# Patient Record
Sex: Male | Born: 1960 | Race: White | Hispanic: No | Marital: Married | Smoking: Former smoker
Health system: Southern US, Community
[De-identification: ages and names within clinical notes are randomized; demographics above are authoritative.]

## PROBLEM LIST (undated history)

## (undated) DIAGNOSIS — K469 Unspecified abdominal hernia without obstruction or gangrene: Secondary | ICD-10-CM

## (undated) DIAGNOSIS — E785 Hyperlipidemia, unspecified: Secondary | ICD-10-CM

## (undated) HISTORY — DX: Hyperlipidemia, unspecified: E78.5

---

## 2011-02-22 ENCOUNTER — Encounter: Payer: Self-pay | Admitting: Family Medicine

## 2011-02-22 ENCOUNTER — Ambulatory Visit (INDEPENDENT_AMBULATORY_CARE_PROVIDER_SITE_OTHER): Payer: BC Managed Care – PPO | Admitting: Family Medicine

## 2011-02-22 DIAGNOSIS — Z125 Encounter for screening for malignant neoplasm of prostate: Secondary | ICD-10-CM

## 2011-02-22 DIAGNOSIS — Z13220 Encounter for screening for lipoid disorders: Secondary | ICD-10-CM

## 2011-02-22 DIAGNOSIS — Z0000 Encounter for general adult medical examination without abnormal findings: Secondary | ICD-10-CM

## 2011-02-22 DIAGNOSIS — Z13228 Encounter for screening for other metabolic disorders: Secondary | ICD-10-CM

## 2011-02-22 DIAGNOSIS — K469 Unspecified abdominal hernia without obstruction or gangrene: Secondary | ICD-10-CM

## 2011-02-22 DIAGNOSIS — Z130 Encounter for screening for diseases of the blood and blood-forming organs and certain disorders involving the immune mechanism: Secondary | ICD-10-CM

## 2011-02-22 DIAGNOSIS — Z1329 Encounter for screening for other suspected endocrine disorder: Secondary | ICD-10-CM

## 2011-02-22 NOTE — Patient Instructions (Signed)
Update fasting labs one morning downstairs .  Go before you eat or drink anything.  Will call you w/ results.  Referral made to general surgery downstairs for hernia.  Return for f/u BP/ ear wax / EKG if BP is still high in 6 wks.

## 2011-02-23 ENCOUNTER — Encounter: Payer: Self-pay | Admitting: Family Medicine

## 2011-02-23 NOTE — Progress Notes (Signed)
  Subjective:    Patient ID: Aaron Crawford, male    DOB: [birth year], 50 y.o.   MRN: [id number]  HPI 50 yo WM presents for NOV, CPE.  Has not been to a doctor in about 5 yrs.  He has a hx of high cholesterol but is not on any meds.  Due for fasting labs.  Thinks is tetanus vaccine was done < 10 yrs ago.  He has a fam hx of premenopausal breast cancer (mom), prostate cancer in 35s (dad) and AMI at 5 (dad).  His sister has diabetes.  He exercises regularly and eats fairl healthy.  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  He has a 'lump' in the L pelvis x months that is not tender.    BP 139/91  Pulse 108  Ht 6' (1.829 m)  Wt 177 lb (80.287 kg)  BMI 24.01 kg/m2  SpO2 97%  Past Medical History  Diagnosis Date  . Hyperlipidemia     History reviewed. No pertinent past surgical history.  Family History  Problem Relation Age of Onset  . Alcohol abuse Mother   . Cancer Mother   . Cancer Father   . Heart disease Father   . Diabetes Sister     History   Social History  . Marital Status: Married    Spouse Name: Olegario Messier    Number of Children: 4  . Years of Education: 11   Occupational History  . carpenter/ musician     self employed   Social History Main Topics  . Smoking status: Former Smoker    Quit date: 02/22/1989  . Smokeless tobacco: Not on file  . Alcohol Use: 1.8 oz/week    3 Cans of beer per week  . Drug Use: No  . Sexually Active: Yes    Birth Control/ Protection: None   Other Topics Concern  . Not on file   Social History Narrative  . No narrative on file    Allergies  Allergen Reactions  . Penicillins     No current outpatient prescriptions on file.  Review of Systems Gen: no fevers, chills, hot flashes, night sweats, change in weight GI: no N/V/C/D GU: no dysuria, incontinence or sexual dysfunction CV: no chest pain, DOE, palpitations s or edema Pulm:  Denies CP, SOB or chronic cough     Objective:   Physical Exam Gen: alert, well  groomed in NAD Neck: no thyromegaly or cervical lymphadenopathy CV: RRR w/o murmur, no audible carotid bruits or abdominal aortic bruits Ext: no edema, clubbing or cyanosis Lungs: CTA bilat w/o W/R/R; nonlabored HEENT:  [place]/AT; PERRLA; oropharynx pink and moist with good dentition Abd: soft, NT, ND, NABS, No HSM, no audible AA bruits, reproducible and reducible L inguinal mass. Skin: warm and dry; no rash, pallor or jaundice Psych: does not appear anxious or depressed; answers questions appropriately GU: external hemorrhoidal skin tags, guiac neg, prostate is 1+ enlarged w/o nodules, masses or tenderness.         Assessment & Plan:  Assesment:  1. CPE- Keeping healthy checklist for men reviewed today.  BP elevated today, 1st time.Marland Kitchen  BMI 25 at goal.       Labs ordered Colonoscopy DRE/ PSA done. Encouraged healthy diet, regular exercise, MVI daily. Return for next physical in 1 yr.    Refer to gen surg for likely L inguinal hernia.

## 2011-02-25 ENCOUNTER — Encounter: Payer: Self-pay | Admitting: Family Medicine

## 2011-03-03 ENCOUNTER — Encounter (INDEPENDENT_AMBULATORY_CARE_PROVIDER_SITE_OTHER): Payer: Self-pay | Admitting: Surgery

## 2011-03-03 LAB — CBC WITH DIFFERENTIAL/PLATELET
Lymphocytes Relative: 30 % (ref 12–46)
Lymphs Abs: 1.8 10*3/uL (ref 0.7–4.0)
Neutrophils Relative %: 67 % (ref 43–77)
Platelets: 314 10*3/uL (ref 150–400)
RBC: 5.22 MIL/uL (ref 4.22–5.81)
WBC: 6 10*3/uL (ref 4.0–10.5)

## 2011-03-03 LAB — LIPID PANEL: HDL: 65 mg/dL (ref >39)

## 2011-03-03 LAB — PSA: PSA: 0.43 ng/mL (ref <=4.00)

## 2011-03-04 LAB — COMPLETE METABOLIC PANEL WITH GFR
CO2: 28 mEq/L (ref 19–32)
GFR, Est African American: >60 mL/min (ref >60)
GFR, Est Non African American: >60 mL/min (ref >60)
Glucose, Bld: 93 mg/dL (ref 70–99)
Sodium: 139 mEq/L (ref 135–145)
Total Bilirubin: 0.9 mg/dL (ref 0.3–1.2)
Total Protein: 6.6 g/dL (ref 6.0–8.3)

## 2011-03-07 ENCOUNTER — Telehealth: Payer: Self-pay | Admitting: Family Medicine

## 2011-03-07 NOTE — Telephone Encounter (Signed)
Lab will fax them

## 2011-03-07 NOTE — Telephone Encounter (Signed)
Aaron Crawford, pls call the lab and find out where the rest of his labs are???  Thanks.

## 2011-03-08 ENCOUNTER — Telehealth: Payer: Self-pay | Admitting: Family Medicine

## 2011-03-08 NOTE — Telephone Encounter (Signed)
LMOM informing Pt of the above 

## 2011-03-08 NOTE — Telephone Encounter (Signed)
Pls let pt know that his fasting glucose, liver and kidney function came back normal.  Blood counts are normal, cholesterol is at goal, prostate cancer screen is normal.  Repeat in 1 yr.

## 2011-03-10 ENCOUNTER — Telehealth: Payer: Self-pay | Admitting: Family Medicine

## 2011-03-10 NOTE — Telephone Encounter (Signed)
Please call pt.  Chemistry panel shows a normal fasting sugar, liver and kidney function with excellent cholesterol results.  Prostate cancer screen is normal Repeat in 1-2 yrs.

## 2011-03-13 NOTE — Telephone Encounter (Signed)
LMOM informing Pt of the above 

## 2011-04-04 ENCOUNTER — Ambulatory Visit
Admission: RE | Admit: 2011-04-04 | Discharge: 2011-04-04 | Disposition: A | Discharge status: Home / Self Care | Payer: BC Managed Care – PPO | Source: Ambulatory Visit | Attending: Family Medicine | Admitting: Family Medicine

## 2011-04-04 ENCOUNTER — Encounter: Payer: Self-pay | Admitting: Family Medicine

## 2011-04-04 ENCOUNTER — Ambulatory Visit (INDEPENDENT_AMBULATORY_CARE_PROVIDER_SITE_OTHER): Payer: BC Managed Care – PPO | Admitting: Family Medicine

## 2011-04-04 DIAGNOSIS — R109 Unspecified abdominal pain: Secondary | ICD-10-CM

## 2011-04-04 DIAGNOSIS — K4090 Unilateral inguinal hernia, without obstruction or gangrene, not specified as recurrent: Secondary | ICD-10-CM

## 2011-04-04 MED ORDER — IOHEXOL 300 MG/ML  SOLN
100.0000 mL | Freq: Once | INTRAMUSCULAR | Status: AC | PRN
  Administered 2011-04-04: 100 mL via INTRAVENOUS

## 2011-04-04 NOTE — Progress Notes (Signed)
  Subjective:    Patient ID: Aaron Crawford, male    DOB: Jul 27, 1961, 50 y.o.   MRN: [id number]  HPI  50 yo WM presents for RUQ pain that started 2 wks ago that has radiated to the R lower/ mid region.  This pain has become sharp the last few days and is not relieved by BMs, eating or position changes.  Denies N/V or fevers.  Denies blood in his stool.  Saw Dr Michaell Cowing for bilat inguinal hernias in June and was putting of repair.  Not taking anything for pain.  Denies constipation.    BP 132/90  Pulse 99  Ht 6' (1.829 m)  Wt 174 lb (78.926 kg)  BMI 23.60 kg/m2  SpO2 98%    Review of Systems  Constitutional: Negative for fever.  Respiratory: Negative for shortness of breath.   Cardiovascular: Negative for chest pain.  Gastrointestinal: Positive for abdominal pain. Negative for nausea, vomiting, diarrhea, constipation, blood in stool and abdominal distention.  Genitourinary: Negative for difficulty urinating.       Objective:   Physical Exam  Constitutional: He appears well-developed and well-nourished.  HENT:  Mouth/Throat: Oropharynx is clear and moist.       Cerumen impaction succesfully irrigated successfully  Eyes: No scleral icterus.  Neck: Neck supple.  Cardiovascular: Normal rate, regular rhythm and normal heart sounds.   Pulmonary/Chest: Effort normal and breath sounds normal.  Abdominal: He exhibits distension (mild, diffuse). There is tenderness (RUQ, R periumbilical TTP without guarding). There is no guarding.  Musculoskeletal: He exhibits no edema.  Lymphadenopathy:    He has no cervical adenopathy.  Skin: Skin is warm and dry.  Psychiatric: He has a normal mood and affect.          Assessment & Plan:

## 2011-04-04 NOTE — Patient Instructions (Signed)
CT abdomen/ pelvis today.  Will call you w/ results tomorrow.  If CT is normal, will get some labs tomorrow.

## 2011-04-04 NOTE — Assessment & Plan Note (Signed)
Concerning for bowel obstruction given know bilat inguinal hernias for which he saw Dr Michaell Cowing for in June and put off surgical repair + he does a lot of heavy lifting.    Will get CT Abd/ pelvis with contrast today and f/u results.

## 2011-04-05 ENCOUNTER — Telehealth: Payer: Self-pay | Admitting: Family Medicine

## 2011-04-05 DIAGNOSIS — R109 Unspecified abdominal pain: Secondary | ICD-10-CM

## 2011-04-05 DIAGNOSIS — M899 Disorder of bone, unspecified: Secondary | ICD-10-CM

## 2011-04-05 NOTE — Telephone Encounter (Signed)
I spoke to pt with results and he's now having some pain down the R leg.  He agrees to go for labs today and will set him up for Ct guided biopsy of the lytic lesion on CT abdomen.

## 2011-04-05 NOTE — Telephone Encounter (Signed)
I called and LMOM for pt to call back for his CT results. I added lab order to be done today (or tomorrow), non fasting downstairs.  I talked to Dr Michaell Cowing and he says that interventional radiology is the best choice to biopsy this lytic lesion. I will wait to put the referral in until after I speak to him.

## 2011-04-06 ENCOUNTER — Telehealth: Payer: Self-pay | Admitting: Family Medicine

## 2011-04-06 ENCOUNTER — Other Ambulatory Visit: Payer: Self-pay | Admitting: Family Medicine

## 2011-04-06 DIAGNOSIS — M899 Disorder of bone, unspecified: Secondary | ICD-10-CM

## 2011-04-06 LAB — CBC WITH DIFFERENTIAL/PLATELET
HCT: 46.2 % (ref 39.0–52.0)
Hemoglobin: 14.5 g/dL (ref 13.0–17.0)
Lymphocytes Relative: 28 % (ref 12–46)
Lymphs Abs: 2 10*3/uL (ref 0.7–4.0)
MCHC: 31.4 g/dL (ref 30.0–36.0)
Monocytes Absolute: 0.5 10*3/uL (ref 0.1–1.0)
Monocytes Relative: 7 % (ref 3–12)
Neutro Abs: 4.7 10*3/uL (ref 1.7–7.7)
RBC: 5.29 MIL/uL (ref 4.22–5.81)
WBC: 7.3 10*3/uL (ref 4.0–10.5)

## 2011-04-06 LAB — COMPLETE METABOLIC PANEL WITH GFR
Albumin: 4.7 g/dL (ref 3.5–5.2)
BUN: 13 mg/dL (ref 6–23)
CO2: 26 mEq/L (ref 19–32)
Calcium: 10.1 mg/dL (ref 8.4–10.5)
Chloride: 102 mEq/L (ref 96–112)
GFR, Est Non African American: >60 mL/min (ref >60)
Glucose, Bld: 100 mg/dL — ABNORMAL HIGH (ref 70–99)
Potassium: 4.4 mEq/L (ref 3.5–5.3)

## 2011-04-06 NOTE — Telephone Encounter (Signed)
Pls let pt know that his blood counts and chemistry panel came back completely normal which is great news.

## 2011-04-06 NOTE — Telephone Encounter (Signed)
Pt aware of the above  

## 2011-04-06 NOTE — Telephone Encounter (Signed)
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  Recommendation is to get a CT of the pelvis in 6 mos to follow size and that this is not the cause for his pain.  Reassured pt and also with normal labs, this sounds like a good plan.  If his abd pain continues, recommend setting him up with GI but he will let me know next wk.

## 2011-04-06 NOTE — Telephone Encounter (Signed)
I changed the order to a CT - biopsy under meds and orders but I want to make sure they got my order and schedule this prompty.  Thanks.

## 2011-04-10 ENCOUNTER — Telehealth: Payer: Self-pay | Admitting: Family Medicine

## 2011-04-10 NOTE — Telephone Encounter (Signed)
Pt called and recently was seen and sent for CT abd/pelvis, and pt would like to get another radiologist to read the Ct scan.  Pt has reservations since scan basically norm because his mother had bone scan and was told no cancer and then 1 year later died from bone cancer. Plan:  Told pt will send message to Dr. Cathey Endow and she'll get when she returns at the end of the week and will call him once Dr. Cathey Endow reviews and replies. Routed to Dr. Arlice Colt, LPN Domingo Dimes

## 2011-04-11 NOTE — Telephone Encounter (Signed)
Pls contact [facility] and see if a radiologist can call pt to discuss his results over the phone.

## 2011-04-19 NOTE — Telephone Encounter (Signed)
I did get it.  Will print off and sent to patient.  I am assuming the radiologist did not call him.

## 2011-04-19 NOTE — Telephone Encounter (Signed)
Dr. Cathey Endow, You should be receiving a report on this pt today with an addendum where you had requested a diff radiologist to read.  Thanks. Jarvis Newcomer, LPN Domingo Dimes

## 2011-04-19 NOTE — Telephone Encounter (Signed)
Called and spoke with Isaiah Serge to get a second radiologist to read for 2nd opinion due to pt strong family history of bone cancer per Dr. Cathey Endow order. Jarvis Newcomer, LPN Domingo Dimes

## 2011-04-27 ENCOUNTER — Encounter: Payer: Self-pay | Admitting: Family Medicine

## 2011-04-27 DIAGNOSIS — E785 Hyperlipidemia, unspecified: Secondary | ICD-10-CM | POA: Insufficient documentation

## 2011-07-10 ENCOUNTER — Inpatient Hospital Stay (INDEPENDENT_AMBULATORY_CARE_PROVIDER_SITE_OTHER)
Admission: RE | Admit: 2011-07-10 | Discharge: 2011-07-10 | Disposition: A | Discharge status: Home / Self Care | Payer: BC Managed Care – PPO | Source: Ambulatory Visit | Attending: Emergency Medicine | Admitting: Emergency Medicine

## 2011-07-10 ENCOUNTER — Encounter: Payer: Self-pay | Admitting: Emergency Medicine

## 2011-07-10 DIAGNOSIS — L02519 Cutaneous abscess of unspecified hand: Secondary | ICD-10-CM

## 2011-07-10 DIAGNOSIS — L03019 Cellulitis of unspecified finger: Secondary | ICD-10-CM

## 2011-07-10 DIAGNOSIS — Z23 Encounter for immunization: Secondary | ICD-10-CM

## 2011-08-28 NOTE — Progress Notes (Signed)
Summary: Infected Index Finger rm 5   Vital Signs:  Patient Profile:   50 Years Old Male CC:      LT 2nd finger infection/splinter x 1wk  Height:     72 inches Weight:      180 pounds O2 Sat:      99 % O2 treatment:    Room Air Temp:     98.4 degrees F oral Pulse rate:   95 / minute Resp:     18 per minute BP sitting:   143 / 81  (left arm) Cuff size:   regular  Vitals Entered By: Clemens Catholic LPN (July 10, 2011 10:29 AM)                  Updated Prior Medication List: No Medications Current Allergies: No known allergies History of Present Illness Chief Complaint: LT 2nd finger infection/splinter x 1wk  History of Present Illness: L index finger with splinter last week.  Feels swollen and and little red.  He squeezed it and feels he got everything out.  Over the past few days, has been stable.  No F/C.  No drainage.    REVIEW OF SYSTEMS Constitutional Symptoms      Denies fever, chills, night sweats, weight loss, weight gain, and fatigue.  Eyes       Denies change in vision, eye pain, eye discharge, glasses, contact lenses, and eye surgery. Ear/Nose/Throat/Mouth       Denies hearing loss/aids, change in hearing, ear pain, ear discharge, dizziness, frequent runny nose, frequent nose bleeds, sinus problems, sore throat, hoarseness, and tooth pain or bleeding.  Respiratory       Denies dry cough, productive cough, wheezing, shortness of breath, asthma, bronchitis, and emphysema/COPD.  Cardiovascular       Denies murmurs, chest pain, and tires easily with exhertion.    Gastrointestinal       Denies stomach pain, nausea/vomiting, diarrhea, constipation, blood in bowel movements, and indigestion. Genitourniary       Denies painful urination, kidney stones, and loss of urinary control. Neurological       Denies paralysis, seizures, and fainting/blackouts. Musculoskeletal       Denies muscle pain, joint pain, joint stiffness, decreased range of motion, redness,  swelling, muscle weakness, and gout.  Skin       Denies bruising, unusual mles/lumps or sores, and hair/skin or nail changes.  Psych       Denies mood changes, temper/anger issues, anxiety/stress, speech problems, depression, and sleep problems. Other Comments: pt c/o LT 2nd finger infection/splinter x 1wk.    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  FROM active and passive.  No drainage.  Distal NV status intact.  No fluctuance. Assessment New Problems: ABSCESS, FINGER (ICD-681.00)   Plan New Medications/Changes: DOXYCYCLINE HYCLATE 100 MG CAPS (DOXYCYCLINE HYCLATE) 1 by mouth two times a day for 8 days  #16 x 0, 07/10/2011, Hoyt Koch MD  New Orders: New Patient Level III [99203] Tdap => 41yrs IM [90715] Admin 1st Vaccine [90471] Planning Comments:   Warm compresses.  Rx for Doxy.  We discussed and since  fairly mild, will hold off on I&D for now.  I don't feel any fluid/induration, so an I&D will likely not yield anything.  If not improving, or if worsening, should see an hand surgeon to consider I&D into and make sure no retained object.  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  Td given.   The patient and/or caregiver has been counseled thoroughly with regard to medications prescribed including dosage, schedule, interactions, rationale for use, and possible side effects and they verbalize understanding.  Diagnoses and expected course of recovery discussed and will return if not improved as expected or if the condition worsens. Patient  and/or caregiver verbalized understanding.  Prescriptions: DOXYCYCLINE HYCLATE 100 MG CAPS (DOXYCYCLINE HYCLATE) 1 by mouth two times a day for 8 days  #16 x 0   Entered and Authorized by:   Hoyt Koch MD   Signed by:   Hoyt Koch MD on 07/10/2011   Method used:   Print then Give to Patient   RxID:   [id number]   Orders Added: 1)  New Patient Level III [14782] 2)  Tdap => 85yrs IM [95621] 3)  Admin 1st Vaccine [30865]   Immunizations Administered:  Tetanus Vaccine:    Vaccine Type: Tdap    Site: left deltoid    Mfr: GlaxoSmithKline    Dose: 0.5 ml    Route: IM    Given by: Clemens Catholic LPN    Exp. Date: 08/18/2012    Lot #: [id number]    VIS given: 08/12/08 version given July 10, 2011.   Immunizations Administered:  Tetanus Vaccine:    Vaccine Type: Tdap    Site: left deltoid    Mfr: GlaxoSmithKline    Dose: 0.5 ml    Route: IM    Given by: Clemens Catholic LPN    Exp. Date: 08/18/2012    Lot #: [id number]    VIS given: 08/12/08 version given July 10, 2011.

## 2011-10-24 ENCOUNTER — Ambulatory Visit (INDEPENDENT_AMBULATORY_CARE_PROVIDER_SITE_OTHER): Payer: BC Managed Care – PPO | Admitting: Physician Assistant

## 2011-10-24 ENCOUNTER — Encounter: Payer: Self-pay | Admitting: Physician Assistant

## 2011-10-24 VITALS — BP 121/70 | HR 100 | Wt 182.0 lb

## 2011-10-24 DIAGNOSIS — H6123 Impacted cerumen, bilateral: Secondary | ICD-10-CM

## 2011-10-24 DIAGNOSIS — H6120 Impacted cerumen, unspecified ear: Secondary | ICD-10-CM

## 2011-10-24 DIAGNOSIS — K4090 Unilateral inguinal hernia, without obstruction or gangrene, not specified as recurrent: Secondary | ICD-10-CM

## 2011-10-24 NOTE — Patient Instructions (Signed)
Ears lavaged. Need to schedule appointment with Dr. Michaell Cowing for inguinal repair surgery.

## 2011-10-24 NOTE — Progress Notes (Signed)
  Subjective:    Patient ID: Aaron Crawford, male    DOB: 1961-03-05, 51 y.o.   MRN: [id number]  HPI Patient comes in today because it has become harder to hear. He knows when this happens that he needs his ears cleaned out. He has had this problem for years and comes in regularly to get this done.  He has tried OTC debrox and other cerumen removal agents but they have not worked. He denies any other problems with upper respiratory system such as cough, fever, sore throat, or chills.   We discussed his inguinal hernia. He was seen in June 2012 by Dr. Michaell Cowing his surgeon recommended he have repaired within 2 months. Patient still has not had repaired. He states that work is too busy right now to have surgery. He has noticed the bulge has not been present lately. He denies any pain.  Review of Systems     Objective:   Physical Exam  Constitutional: He is oriented to person, place, and time. He appears well-developed and well-nourished.  HENT:  Head: Normocephalic and atraumatic.  Nose: Nose normal.  Mouth/Throat: Oropharynx is clear and moist. No oropharyngeal exudate.       Upon visualization cerumen was seen bilaterally. After cerumen removed normal TM's bilaterally.  Eyes: Right eye exhibits no discharge. Left eye exhibits no discharge.  Neck: Normal range of motion. Neck supple.  Cardiovascular: Regular rhythm and normal heart sounds.        Tachycardia at 100.  Pulmonary/Chest: Effort normal and breath sounds normal. He has no wheezes.  Neurological: He is alert and oriented to person, place, and time.  Skin: Skin is warm and dry.  Psychiatric: He has a normal mood and affect. His behavior is normal.          Assessment & Plan:  Cerumen Impaction- Cerumen removed. Talked about preventive OTC treatments for ear wax.  Inguinal hernia- Counseled patient for 25 min and greater than 50 percent of visit was face to face counseling. I advised him to make an appointment with Dr. Michaell Cowing and  have repaired. Patient was aware of the need to have this surgery. i talked to patient about risk of bowel obstruction and the signs to look forward. Patient was educated and understood. I told him he could also wear a support band around his lower waist to give him comfort and support.

## 2012-01-12 ENCOUNTER — Emergency Department (INDEPENDENT_AMBULATORY_CARE_PROVIDER_SITE_OTHER): Payer: BC Managed Care – PPO

## 2012-01-12 ENCOUNTER — Encounter (HOSPITAL_BASED_OUTPATIENT_CLINIC_OR_DEPARTMENT_OTHER): Payer: Self-pay

## 2012-01-12 ENCOUNTER — Emergency Department (HOSPITAL_BASED_OUTPATIENT_CLINIC_OR_DEPARTMENT_OTHER)
Admission: EM | Admit: 2012-01-12 | Discharge: 2012-01-12 | Disposition: A | Discharge status: Home / Self Care | Payer: BC Managed Care – PPO | Attending: Emergency Medicine | Admitting: Emergency Medicine

## 2012-01-12 DIAGNOSIS — K4090 Unilateral inguinal hernia, without obstruction or gangrene, not specified as recurrent: Secondary | ICD-10-CM

## 2012-01-12 DIAGNOSIS — Z7982 Long term (current) use of aspirin: Secondary | ICD-10-CM | POA: Insufficient documentation

## 2012-01-12 DIAGNOSIS — R1031 Right lower quadrant pain: Secondary | ICD-10-CM

## 2012-01-12 DIAGNOSIS — E785 Hyperlipidemia, unspecified: Secondary | ICD-10-CM | POA: Insufficient documentation

## 2012-01-12 DIAGNOSIS — R109 Unspecified abdominal pain: Secondary | ICD-10-CM | POA: Insufficient documentation

## 2012-01-12 HISTORY — DX: Unspecified abdominal hernia without obstruction or gangrene: K46.9

## 2012-01-12 LAB — URINALYSIS, ROUTINE W REFLEX MICROSCOPIC
Glucose, UA: NEGATIVE mg/dL
Ketones, ur: NEGATIVE mg/dL
Leukocytes, UA: NEGATIVE
Nitrite: NEGATIVE
Specific Gravity, Urine: 1.017 (ref 1.005–1.030)
pH: 7.5 (ref 5.0–8.0)

## 2012-01-12 LAB — DIFFERENTIAL
Basophils Relative: 0 % (ref 0–1)
Lymphocytes Relative: 33 % (ref 12–46)
Lymphs Abs: 2.3 10*3/uL (ref 0.7–4.0)
Monocytes Absolute: 0.9 10*3/uL (ref 0.1–1.0)
Monocytes Relative: 13 % — ABNORMAL HIGH (ref 3–12)
Neutro Abs: 3.7 10*3/uL (ref 1.7–7.7)
Neutrophils Relative %: 54 % (ref 43–77)

## 2012-01-12 LAB — COMPREHENSIVE METABOLIC PANEL
Albumin: 4.3 g/dL (ref 3.5–5.2)
Alkaline Phosphatase: 79 U/L (ref 39–117)
BUN: 16 mg/dL (ref 6–23)
CO2: 27 mEq/L (ref 19–32)
Chloride: 103 mEq/L (ref 96–112)
Creatinine, Ser: 0.9 mg/dL (ref 0.50–1.35)
GFR calc non Af Amer: >90 mL/min (ref >90)
Potassium: 4.4 mEq/L (ref 3.5–5.1)
Total Bilirubin: 0.4 mg/dL (ref 0.3–1.2)

## 2012-01-12 LAB — CBC
HCT: 43.9 % (ref 39.0–52.0)
Hemoglobin: 15 g/dL (ref 13.0–17.0)
MCHC: 34.2 g/dL (ref 30.0–36.0)
RBC: 5.37 MIL/uL (ref 4.22–5.81)
WBC: 6.9 10*3/uL (ref 4.0–10.5)

## 2012-01-12 MED ORDER — IOHEXOL 300 MG/ML  SOLN
100.0000 mL | Freq: Once | INTRAMUSCULAR | Status: AC | PRN
  Administered 2012-01-12: 100 mL via INTRAVENOUS

## 2012-01-12 MED ORDER — IOHEXOL 300 MG/ML  SOLN
20.0000 mL | INTRAMUSCULAR | Status: AC
  Administered 2012-01-12: 20 mL via ORAL

## 2012-01-12 NOTE — ED Provider Notes (Signed)
History     CSN: [id number]  Arrival date & time 01/12/12  Ernestina Columbia   First MD Initiated Contact with Patient 01/12/12 1953      Chief Complaint  Patient presents with  . Hernia    (Consider location/radiation/quality/duration/timing/severity/associated sxs/prior treatment) HPI Comments: Pt states that he has a history of hernia and he has intermittent pain but this is worse then normal  Patient is a 51 y.o. male presenting with abdominal pain. The history is provided by the patient. No language interpreter was used.  Abdominal Pain The primary symptoms of the illness include abdominal pain. The primary symptoms of the illness do not include fever, nausea, vomiting or diarrhea. The current episode started 6 to 12 hours ago. The onset of the illness was gradual. The problem has not changed since onset. The patient states that she believes she is currently not pregnant. The patient has not had a change in bowel habit.    Past Medical History  Diagnosis Date  . Hyperlipidemia   . Hernia     History reviewed. No pertinent past surgical history.  Family History  Problem Relation Age of Onset  . Alcohol abuse Mother   . Cancer Mother   . Cancer Father   . Heart disease Father   . Diabetes Sister     History  Substance Use Topics  . Smoking status: Former Smoker    Quit date: 02/22/1989  . Smokeless tobacco: Not on file  . Alcohol Use: 1.8 oz/week    3 Cans of beer per week      Review of Systems  Constitutional: Negative.  Negative for fever.  HENT: Negative.   Eyes: Negative.   Respiratory: Negative.   Cardiovascular: Negative.   Gastrointestinal: Positive for abdominal pain. Negative for nausea, vomiting and diarrhea.  Neurological: Negative.     Allergies  Penicillins  Home Medications   Current Outpatient Rx  Name Route Sig Dispense Refill  . ASPIRIN 325 MG PO TABS Oral Take 325 mg by mouth daily. Patient uses this medication for maintenance.    Marland Kitchen  GLUCOSAMINE-CHONDROITIN 500-400 MG PO TABS Oral Take 1 tablet by mouth 3 (three) times daily.      BP 127/84  Pulse 87  Temp(Src) 98.4 F (36.9 C) (Oral)  Resp 17  Ht 6' (1.829 m)  Wt 183 lb (83.008 kg)  BMI 24.82 kg/m2  SpO2 99%  Physical Exam  Nursing note and vitals reviewed. Constitutional: He appears well-developed and well-nourished.  Cardiovascular: Normal rate and regular rhythm.   Pulmonary/Chest: Effort normal and breath sounds normal.  Abdominal: Soft. Bowel sounds are normal. There is tenderness.  Genitourinary:       Pt has a left inguinal hernia:that is reducible  Musculoskeletal: Normal range of motion.    ED Course  Procedures (including critical care time)  Labs Reviewed  DIFFERENTIAL - Abnormal; Notable for the following:    Monocytes Relative 13 (*)    All other components within normal limits  COMPREHENSIVE METABOLIC PANEL - Abnormal; Notable for the following:    Glucose, Bld 101 (*)    All other components within normal limits  URINALYSIS, ROUTINE W REFLEX MICROSCOPIC  CBC   No results found.   No diagnosis found.    MDM  Pt waiting for ct:Dr. Ignacia Palma assumed care       Teressa Lower, NP 01/12/12 2223

## 2012-01-12 NOTE — Discharge Instructions (Signed)

## 2012-01-12 NOTE — ED Provider Notes (Deleted)
11:24 PM CT of abdomen and pelvis showed no serious cause for right upper abdominal pain.  I advised GI followup for this pain and referred him to [facility].  He has a bone lesion in the right iliac crest; this was unchanged from a previous abdominal/pelvic CT and appeared benign to the radiologist.   Carleene Cooper III, M.d.   Carleene Cooper III, MD 01/12/12 [phone]

## 2012-01-12 NOTE — ED Notes (Signed)
Pt states that he has right sided abdominal hernia, was sent by Urgent care in [place].  Pt states that he as dx with hernia 9 mo ago, today after working it was much more painful.

## 2012-01-13 NOTE — ED Provider Notes (Signed)
Medical screening examination/treatment/procedure(s) were conducted as a shared visit with non-physician practitioner(s) and myself.  I personally evaluated the patient during the encounter 11:24 PM CT of abdomen and pelvis showed no serious cause for right upper abdominal pain.  I advised GI followup for this pain and referred him to [facility].  He has a bone lesion in the right iliac crest; this was unchanged from a previous abdominal/pelvic CT and appeared benign to the radiologist.   Carleene Cooper III, M.d.   Carleene Cooper III, MD 01/12/12 1610   Carleene Cooper III, MD 01/13/12 [phone]

## 2012-09-10 ENCOUNTER — Encounter: Payer: Self-pay | Admitting: Family Medicine

## 2012-09-10 ENCOUNTER — Ambulatory Visit (INDEPENDENT_AMBULATORY_CARE_PROVIDER_SITE_OTHER): Payer: BC Managed Care – PPO | Admitting: Family Medicine

## 2012-09-10 VITALS — BP 125/73 | HR 72 | Ht 72.0 in | Wt 176.0 lb

## 2012-09-10 DIAGNOSIS — E785 Hyperlipidemia, unspecified: Secondary | ICD-10-CM

## 2012-09-10 DIAGNOSIS — H6120 Impacted cerumen, unspecified ear: Secondary | ICD-10-CM

## 2012-09-10 DIAGNOSIS — K4020 Bilateral inguinal hernia, without obstruction or gangrene, not specified as recurrent: Secondary | ICD-10-CM

## 2012-09-10 NOTE — Progress Notes (Signed)
  Subjective:    Patient ID: Aaron Crawford, male    DOB: 09-Aug-1961, 51 y.o.   MRN: [id number]  HPI Here today for cerumen impaction-about once a year he comes in to get his ear is irrigated. He is to the point where he has difficulty hearing. He has no other specific complaints. He did want to give me an update and let me know that he was recently diagnosed with bilateral inguinal hernias. He says only occasionally doesn't protrude and cause a problem. He is decided at this point in time not to have surgery wants to just see how long he can go without any difficulty or problems.  Hyperlipidemia-he has had a slightly elevated LDL and the past but has had fantastic HDL. He denies any premature family history of heart disease, personal history of heart disease, he has never been a smoker, and has the diagnosis of high blood pressure. He tries to eat healthy and he has a normal weight.   Review of Systems     Objective:   Physical Exam  Constitutional: He is oriented to person, place, and time.  HENT:  Head: Normocephalic and atraumatic.  Right Ear: External ear normal.  Left Ear: External ear normal.       TMs and canals appear to be clear after bilateral irrigation.  Cardiovascular: Normal rate, regular rhythm and normal heart sounds.   Pulmonary/Chest: Effort normal.  Neurological: He is alert and oriented to person, place, and time.  Skin: Skin is dry.  Psychiatric: He has a normal mood and affect. His behavior is normal.          Assessment & Plan:  Cerumen impaction-patient tolerated irrigation well, and exam was normal.  Hyperlipidemia-he is overall low risk for heart disease and stroke and based on his last LDL approximately 18 months ago he does not need to be started on a statin. I did recommend that he check his cholesterol every 1-2 years just to make sure that it's not going out. He does have a great HDL which is beneficial.  Bilateral inguinal hernia-we did discuss that  sometimes it is better to have this surgically corrected before becomes a problem. Certainly if he notices that is becoming more comfortable more frequently sore and he needs to consider having the surgery electively versus emergently.

## 2012-09-10 NOTE — Patient Instructions (Addendum)
Please schedule a physical sometime this spring.

## 2012-11-13 ENCOUNTER — Ambulatory Visit (INDEPENDENT_AMBULATORY_CARE_PROVIDER_SITE_OTHER): Payer: BC Managed Care – PPO | Admitting: Family Medicine

## 2012-11-13 ENCOUNTER — Encounter: Payer: Self-pay | Admitting: Family Medicine

## 2012-11-13 VITALS — BP 149/84 | HR 87 | Ht 72.0 in | Wt 174.0 lb

## 2012-11-13 DIAGNOSIS — F411 Generalized anxiety disorder: Secondary | ICD-10-CM

## 2012-11-13 DIAGNOSIS — G4700 Insomnia, unspecified: Secondary | ICD-10-CM

## 2012-11-13 DIAGNOSIS — Z8042 Family history of malignant neoplasm of prostate: Secondary | ICD-10-CM

## 2012-11-13 DIAGNOSIS — R002 Palpitations: Secondary | ICD-10-CM

## 2012-11-13 DIAGNOSIS — Z125 Encounter for screening for malignant neoplasm of prostate: Secondary | ICD-10-CM

## 2012-11-13 LAB — COMPLETE METABOLIC PANEL WITH GFR
ALT: 19 U/L (ref 0–53)
AST: 16 U/L (ref 0–37)
Alkaline Phosphatase: 69 U/L (ref 39–117)
BUN: 9 mg/dL (ref 6–23)
Creat: 0.77 mg/dL (ref 0.50–1.35)
Potassium: 4.3 mEq/L (ref 3.5–5.3)

## 2012-11-13 LAB — CBC
HCT: 44.2 % (ref 39.0–52.0)
MCHC: 33.9 g/dL (ref 30.0–36.0)
MCV: 81 fL (ref 78.0–100.0)
Platelets: 309 10*3/uL (ref 150–400)
RDW: 13.9 % (ref 11.5–15.5)
WBC: 5.4 10*3/uL (ref 4.0–10.5)

## 2012-11-13 LAB — TSH: TSH: 0.751 u[IU]/mL (ref 0.350–4.500)

## 2012-11-13 LAB — PSA: PSA: 0.6 ng/mL (ref <=4.00)

## 2012-11-13 NOTE — Progress Notes (Signed)
CC: Aaron Crawford is a 52 y.o. male is here for Anxiety   Subjective: HPI:  Very pleasant 52 year old here with concerns anxiety  He describes almost daily sensation of what he can only describe his anxiety. He can occur any hour of the day. It often wakes him from sleep if he has had 2-3 drinks of alcohol before bed. He denies more than 4 drinks at a sitting for more than 14 in a week. He denies subjective problem drinking. Daytime symptoms include wearing to much about different things, nothing in particular, and trouble relaxing. He admits there is an element to his anxiety regarding his mother's death from cancer and father's somewhat recent prostate cancer diagnosis.  He admits that anxiety may be worsened by increasing demands of carpentry work, Barista with higher Psychologist, educational. He denies unstable relationships at home with his wife 4 children. He notices when his subjective anxiety is at its worst he often has palpitations. He was seen in emergency room for this and even had a treadmill stress test which was negative. Results from his emergency room stay were able to rule out cardiac disease. He denies depression, paranoia, low libido, erectile dysfunction, hallucinations. He describes himself as a perfectionist but denies obsessive or compulsive tendencies. Above symptoms at the present for about 2-3 months.  Anxiety has been keeping him awake at night on a nightly basis.  He reports trouble mostly getting to sleep but occasionally staying asleep. He denies recurring thoughts keeping him awake, joking he states often he's kept awake with happy thoughts. This is been present almost on a nightly basis for 2-3 months. It improves with rare marijuana use. He denies substance abuse otherwise. He denies more than one serving of caffeine intake a day. He goes to bed in the same bed every night of the week at the same time of the night. He denies environment stimulation while trying to go to  bed.    Review Of Systems Outlined In HPI  Past Medical History  Diagnosis Date  . Hyperlipidemia   . Hernia      Family History  Problem Relation Age of Onset  . Alcohol abuse Mother   . Cancer Mother   . Cancer Father   . Heart disease Father   . Diabetes Sister      History  Substance Use Topics  . Smoking status: Former Smoker    Quit date: 02/22/1989  . Smokeless tobacco: Not on file  . Alcohol Use: 1.8 oz/week    3 Cans of beer per week     Objective: Filed Vitals:   11/13/12 1024  BP: 149/84  Pulse: 87    General: Alert and Oriented, No Acute Distress HEENT: Pupils equal, round, reactive to light. Conjunctivae clear.  Moist mucous membranes, no palpable thyromegaly Lungs: Clear to auscultation bilaterally, no wheezing/ronchi/rales.  Comfortable work of breathing. Good air movement. Cardiac: Regular rate and rhythm. Normal S1/S2.  No murmurs, rubs, nor gallops.   Extremities: No peripheral edema.  Strong peripheral pulses.  Mental Status: No depression, anxiety, nor agitation. Skin: Warm and dry.  Assessment & Plan: Aaron Crawford was seen today for anxiety.  Diagnoses and associated orders for this visit:  Anxiety state, unspecified - TSH - COMPLETE METABOLIC PANEL WITH GFR  Palpitations - TSH - CBC - COMPLETE METABOLIC PANEL WITH GFR  Family history of prostate cancer - PSA  Screening for prostate cancer - PSA  Insomnia    Anxiety: Uncontrolled patient would like to  avoid medications if at all possible, will rule out metabolic or endocrine etiology. He is due for an annual PSA and if normal he admits this would help his anxiety, Palpitations: Will rule out electrolyte abnormality or anemia Insomnia: Uncontrolled, we discussed using melatonin as needed at night. If not improved in 2 weeks we can consider pharmacologic intervention.  25 minutes spent face-to-face during visit today of which at least 50% was counseling or coordinating care regarding  anxiety, insomnia, family history of prostate cancer, palpitations.    Return in about 1 week (around 11/20/2012).

## 2012-11-18 ENCOUNTER — Encounter: Payer: Self-pay | Admitting: Family Medicine

## 2012-11-18 ENCOUNTER — Ambulatory Visit (INDEPENDENT_AMBULATORY_CARE_PROVIDER_SITE_OTHER): Payer: BC Managed Care – PPO | Admitting: Family Medicine

## 2012-11-18 VITALS — BP 148/90 | HR 83 | Wt 174.0 lb

## 2012-11-18 DIAGNOSIS — G4700 Insomnia, unspecified: Secondary | ICD-10-CM

## 2012-11-18 MED ORDER — ZOLPIDEM TARTRATE 10 MG PO TABS: 10.0000 mg | ORAL_TABLET | Freq: Every evening | ORAL | Status: DC | PRN

## 2012-11-18 NOTE — Progress Notes (Signed)
CC: Aaron Crawford is a 52 y.o. male is here for f/u Insomnia   Subjective: HPI:   Patient presents for followup insomnia. Since I saw him last he's been taking melatonin 5 mg nightly. He denies any benefit from this this medication helping him get to sleep or stay asleep. He does to bed around 10:00 nightly, same time everyday, he reports physically feeling calm and relaxed however will lie in bed for up to 2 and 3 hours until he falls asleep.  He denies any reoccurring thoughts that are keeping him awake.  He does however feels that his mind is racing.  Nothing seems to make better. He denies caffeine intake in the evening nor alcohol use but in the past alcohol has made it worse.  He denies snoring and his wife has never observed him stopping breathing while sleeping. He describes the severity of his insomnia as moderate to severe.  Symptoms are present on a daily basis.  Patient denies depression, decreased libido, mental disturbance.   Review Of Systems Outlined In HPI  Past Medical History  Diagnosis Date  . Hyperlipidemia   . Hernia      Family History  Problem Relation Age of Onset  . Alcohol abuse Mother   . Cancer Mother   . Cancer Father   . Heart disease Father   . Diabetes Sister      History  Substance Use Topics  . Smoking status: Former Smoker    Quit date: 02/22/1989  . Smokeless tobacco: Not on file  . Alcohol Use: 1.8 oz/week    3 Cans of beer per week     Objective: Filed Vitals:   11/18/12 0852  BP: 148/90  Pulse: 83    General: Alert and Oriented, No Acute Distress HEENT: Pupils equal, round, reactive to light. Conjunctivae clear.  Moist mucous membranes Lungs: Clear comfortable work of breathing Cardiac: Regular rate and rhythm.  Extremities: No peripheral edema.  Strong peripheral pulses.  Mental Status: No depression, anxiety, nor agitation. Normal thought process, well-dressed, good eye contact. Skin: Warm and dry.  Assessment & Plan: [name] was  seen today for f/u insomnia.  Diagnoses and associated orders for this visit:  Insomnia - zolpidem (AMBIEN) 10 MG tablet; Take 1 tablet (10 mg total) by mouth at bedtime as needed for sleep.    Insomnia: Uncontrolled, discussed behavioral interventions including mindfulness meditation and as needed use of Ambien.  Return in about 4 weeks (around 12/16/2012).

## 2013-05-09 ENCOUNTER — Encounter: Payer: Self-pay | Admitting: *Deleted

## 2013-05-09 ENCOUNTER — Emergency Department
Admission: EM | Admit: 2013-05-09 | Discharge: 2013-05-09 | Disposition: A | Discharge status: Home / Self Care | Payer: BC Managed Care – PPO | Source: Home / Self Care | Attending: Emergency Medicine | Admitting: Emergency Medicine

## 2013-05-09 ENCOUNTER — Emergency Department (INDEPENDENT_AMBULATORY_CARE_PROVIDER_SITE_OTHER): Payer: BC Managed Care – PPO

## 2013-05-09 DIAGNOSIS — IMO0002 Reserved for concepts with insufficient information to code with codable children: Secondary | ICD-10-CM

## 2013-05-09 DIAGNOSIS — M5137 Other intervertebral disc degeneration, lumbosacral region: Secondary | ICD-10-CM

## 2013-05-09 DIAGNOSIS — M79609 Pain in unspecified limb: Secondary | ICD-10-CM

## 2013-05-09 DIAGNOSIS — M549 Dorsalgia, unspecified: Secondary | ICD-10-CM

## 2013-05-09 DIAGNOSIS — M5416 Radiculopathy, lumbar region: Secondary | ICD-10-CM

## 2013-05-09 MED ORDER — TRAMADOL HCL 50 MG PO TABS: 50.0000 mg | ORAL_TABLET | Freq: Four times a day (QID) | ORAL | Status: DC | PRN

## 2013-05-09 MED ORDER — KETOROLAC TROMETHAMINE 60 MG/2ML IM SOLN
60.0000 mg | Freq: Once | INTRAMUSCULAR | Status: AC
  Administered 2013-05-09: 60 mg via INTRAMUSCULAR

## 2013-05-09 MED ORDER — PREDNISONE (PAK) 10 MG PO TABS: ORAL_TABLET | ORAL | Status: DC

## 2013-05-09 MED ORDER — MELOXICAM 7.5 MG PO TABS: 7.5000 mg | ORAL_TABLET | Freq: Every day | ORAL | Status: DC

## 2013-05-09 MED ORDER — METHYLPREDNISOLONE ACETATE 80 MG/ML IJ SUSP
80.0000 mg | Freq: Once | INTRAMUSCULAR | Status: AC
  Administered 2013-05-09: 80 mg via INTRAMUSCULAR

## 2013-05-09 NOTE — ED Provider Notes (Signed)
CSN: [id number]     Arrival date & time 05/09/13  4098 History     First MD Initiated Contact with Patient 05/09/13 [id number]     Chief Complaint  Patient presents with  . Back Pain    Patient is a 52 y.o. male presenting with back pain. The history is provided by the patient.  Back Pain Location:  Lumbar spine (And left paralumbar area) Quality:  Stabbing Radiates to:  L knee (And to a lesser extent right thigh) Pain severity:  Severe Onset quality:  Sudden (Yesterday when he was working with a router in his home, he leaned over and felt acute pain) Duration:  1 day Timing:  Constant Progression:  Unchanged Chronicity:  Recurrent Relieved by: Standing. Worsened by:  Sitting (Twisting, or going from lying to sitting up) Ineffective treatments: Back brace. Associated symptoms: paresthesias (Left thigh) and tingling (Left thigh)   Associated symptoms: no abdominal pain, no bowel incontinence, no chest pain, no dysuria, no fever and no weakness   Risk factors: no hx of cancer, no hx of osteoporosis, no recent surgery and no steroid use    Years ago, he had some low back pain, saw a chiropractor and symptoms resolved. Was told he had some degenerative disc disease. He was doing well without any low back pain for the past year. Then, about a month ago, slept in a different position on the floor and that may have slightly flared the low back pain. Then he had a long drive to [place] a few weeks ago and that caused mild lumbar pain. The severe acute pain started yesterday while working at home, see above.  On 04/04/2011, he had CT abdomen and pelvis with contrast for an unrelated problem. Incidental finding was degenerative disc disease present at L5-S1.--- Also reviewed radiologist report and radiologist addendum of 04/19/2011 describing sclerotic lesion right ilium, and similar but less prominent area left ilium, and the radiologist felt "these represent benign bone islands. I do not recommend any  further evaluation". Past Medical History  Diagnosis Date  . Hyperlipidemia   . Hernia    History reviewed. No pertinent past surgical history. Family History  Problem Relation Age of Onset  . Alcohol abuse Mother   . Cancer Mother   . Cancer Father   . Heart disease Father   . Diabetes Sister    History  Substance Use Topics  . Smoking status: Former Smoker    Quit date: 02/22/1989  . Smokeless tobacco: Never Used  . Alcohol Use: 1.8 oz/week    3 Cans of beer per week    Review of Systems  Constitutional: Negative for fever.  Cardiovascular: Negative for chest pain.  Gastrointestinal: Negative for abdominal pain and bowel incontinence.  Genitourinary: Negative.  Negative for dysuria.  Musculoskeletal: Positive for back pain.  Neurological: Positive for tingling (Left thigh) and paresthesias (Left thigh). Negative for weakness.  All other systems reviewed and are negative.    Allergies  Penicillins and Percocet  Home Medications   Current Outpatient Rx  Name  Route  Sig  Dispense  Refill  . meloxicam (MOBIC) 7.5 MG tablet   Oral   Take 1 tablet (7.5 mg total) by mouth daily. For pain. If needed, may increase to 2 tablets by mouth daily for pain.   30 tablet   0   . predniSONE (STERAPRED UNI-PAK) 10 MG tablet      Take 6 on day one, 5 on day 2, 4 on day 3,  then 3 tablets on day 4, then 2 tabs on day 5, then 1 tablet on day 6.   21 tablet   0   . traMADol (ULTRAM) 50 MG tablet   Oral   Take 1 tablet (50 mg total) by mouth every 6 (six) hours as needed for pain (This is for severe pain. May cause drowsiness.). Maximum dose= 8 tablets per day   20 tablet   0    BP 123/84  Pulse 104  Resp 18  Ht 6' (1.829 m)  Wt 171 lb (77.565 kg)  BMI 23.19 kg/m2  SpO2 99% Physical Exam  Nursing note and vitals reviewed. Constitutional: He is oriented to person, place, and time. He appears well-developed and well-nourished. He is cooperative.  Non-toxic appearance. He  appears distressed (Appears very uncomfortable from low back pain.).  HENT:  Head: Normocephalic and atraumatic.  Mouth/Throat: Oropharynx is clear and moist.  Eyes: EOM are normal. Pupils are equal, round, and reactive to light. No scleral icterus.  Neck: Neck supple.  Cardiovascular: Regular rhythm and normal heart sounds.   Pulmonary/Chest: Effort normal and breath sounds normal. No respiratory distress. He has no wheezes. He has no rales. He exhibits no tenderness.  Abdominal: Soft. There is no tenderness.  Musculoskeletal:       Right hip: Normal.       Left hip: Normal.       Cervical back: He exhibits no tenderness.       Thoracic back: He exhibits no tenderness.       Lumbar back: He exhibits decreased range of motion, tenderness, bony tenderness and spasm. He exhibits no swelling, no edema, no deformity, no laceration and normal pulse.  Right straight leg-raise test.-Positive at 45  Left straight leg-raise test. Positive at 45  Negative Right Luisa Hart test. Negative Left Luisa Hart test.    Neurological: He is alert and oriented to person, place, and time. He has normal strength. He displays no atrophy, no tremor and normal reflexes. No cranial nerve deficit or sensory deficit. He exhibits normal muscle tone. Gait normal.  Reflex Scores:      Patellar reflexes are 2+ on the right side and 2+ on the left side.      Achilles reflexes are 2+ on the right side and 2+ on the left side. Skin: Skin is warm, dry and intact. No lesion and no rash noted.  Psychiatric: He has a normal mood and affect.    ED Course  On 04/04/2011, he had CT abdomen and pelvis with contrast for an unrelated problem. Incidental finding was degenerative disc disease present at L5-S1.  Procedures (including critical care time)  Labs Reviewed - No data to display Dg Lumbar Spine Complete  05/09/2013   *RADIOLOGY REPORT*  Clinical Data: Back and left leg pain.  LUMBAR SPINE - COMPLETE 4+ VIEW  Comparison:  01/12/2012 CT scan.  Findings: Normal alignment of the lumbar vertebral bodies.  Stable degenerative disc disease at L2-3 and L5-S1.  No acute bony findings or destructive bony changes.  No pars defect.  The visualized bony pelvis is intact and the SI joints are intact.  IMPRESSION: Normal alignment and no acute bony findings. Stable mild degenerative disc disease at L2-3 and L5-S1.   Original Report Authenticated By: Rudie Meyer, M.D.   1. Pain, radicular, lumbar    #2 degenerative disc disease MDM  Although no neurologic deficit, he likely has severe radicular lumbar pain and left thigh paresthesia. The pain is severe, so  will treat aggressively. Risks, benefits, alternatives discussed, and he agrees with the following treatment plan: Toradol 60 mg IM. Depo-Medrol 80 mg IM. Prednisone 10 mg-6 day Dosepak. Tramadol prescribed (he has taken as in the past without problems) as needed for severe pain but precautions discussed. Meloxicam 7.5 mg twice a day when necessary moderate pain. He states he's avoiding using Tylenol or ibuprofen because he's tried those in the did not help at all. Red flags discussed. He understands that if not improving in a week, or if any severe or worsening or neurologic symptoms, he will followup with PCP or go to ER stat, and he may need neurosurgical consultation or further workup.--He voiced understanding and agreement with above plans.   Lajean Manes, MD 05/09/13 1447

## 2013-05-09 NOTE — ED Notes (Signed)
Travez presents with low back pain that flared up yesterday. He has a hx of LBP with a degenerating lumbar disc. Pain radiating to legs and tingling in both legs. Pain can be 10/10 with activity/twisting.

## 2013-05-10 ENCOUNTER — Telehealth: Payer: Self-pay | Admitting: Emergency Medicine

## 2013-07-31 ENCOUNTER — Other Ambulatory Visit: Payer: Self-pay

## 2013-09-24 ENCOUNTER — Encounter: Payer: Self-pay | Admitting: Family Medicine

## 2013-09-24 ENCOUNTER — Ambulatory Visit (INDEPENDENT_AMBULATORY_CARE_PROVIDER_SITE_OTHER): Payer: BC Managed Care – PPO | Admitting: Family Medicine

## 2013-09-24 VITALS — BP 148/97 | HR 75 | Wt 190.0 lb

## 2013-09-24 DIAGNOSIS — H9190 Unspecified hearing loss, unspecified ear: Secondary | ICD-10-CM

## 2013-09-24 DIAGNOSIS — H9193 Unspecified hearing loss, bilateral: Secondary | ICD-10-CM

## 2013-09-24 DIAGNOSIS — H6123 Impacted cerumen, bilateral: Secondary | ICD-10-CM

## 2013-09-24 DIAGNOSIS — H6120 Impacted cerumen, unspecified ear: Secondary | ICD-10-CM

## 2013-09-24 NOTE — Progress Notes (Signed)
CC: Aaron Crawford is a 52 y.o. male is here for ears feel clogged   Subjective: HPI:  Complains of bilateral hearing loss it has slowly been worsening over the past approximate month. Described as mild in severity accompanied with fullness in both ears. Denies tinnitus nor roaring sensation nor headache. No interventions as of yet. Nothing seems to make symptoms better or worse. He has had identical presentations in the past requiring cerumen removal.  Denies motor or sensory disturbances other than that described above, denies recent head trauma. Denies dizziness, nor vision loss. Denies nasal congestion, facial pressure cough, nor sore throat  Review Of Systems Outlined In HPI  Past Medical History  Diagnosis Date  . Hyperlipidemia   . Hernia      Family History  Problem Relation Age of Onset  . Alcohol abuse Mother   . Cancer Mother   . Cancer Father   . Heart disease Father   . Diabetes Sister      History  Substance Use Topics  . Smoking status: Former Smoker    Quit date: 02/22/1989  . Smokeless tobacco: Never Used  . Alcohol Use: 1.8 oz/week    3 Cans of beer per week     Objective: Filed Vitals:   09/24/13 1350  BP: 148/97  Pulse: 75    General: Alert and Oriented, No Acute Distress HEENT: Pupils equal, round, reactive to light. Conjunctivae clear.  On initial exam there is moderate to severe cerumen load in both external ear canals both of which are impacted. Following irrigation and removal  canals appear clear with intact TMs with appropriate landmarks.  Middle ear appears open without effusion. Pink inferior turbinates.  Moist mucous membranes, pharynx without inflammation nor lesions.  Neck supple without palpable lymphadenopathy nor abnormal masses. Neuro: Hearing is subjectively completely restored after irrigation of both ear canals Lungs: Comfortable work of breathing clear to auscultation Cardiac: Regular rate and rhythm. Neuro: Cranial nerves II through  XII grossly intact  Assessment & Plan: Sarthak was seen today for ears feel clogged.  Diagnoses and associated orders for this visit:  Cerumen impaction, bilateral  Hearing loss, bilateral    Successful cerumen impaction removal hearing is completely restored back to baseline. I encouraged him to use 50-50 hydrogen peroxide and water soak a cotton ball applied to both ears one to 2 times a week to minimize recurrence of cerumen impaction  Indication: Cerumen impaction of the ear(s) Medical necessity statement: On physical examination, cerumen impairs clinically significant portions of the external auditory canal, and tympanic membrane. Noted obstructive, copious cerumen that cannot be removed without magnification and instrumentations requiring physician skills Consent: Discussed benefits and risks of procedure and verbal consent obtained Procedure: Patient was prepped for the procedure. Utilized an otoscope to assess and take note of the ear canal, the tympanic membrane, and the presence, amount, and placement of the cerumen. Gentle water irrigation and soft plastic curette was utilized to remove cerumen.  Post procedure examination: shows cerumen was completely removed. Patient tolerated procedure well. The patient is made aware that they may experience temporary vertigo, temporary hearing loss, and temporary discomfort. If these symptom last for more than 24 hours to call the clinic or proceed to the ED.     Return if symptoms worsen or fail to improve.

## 2014-04-22 ENCOUNTER — Ambulatory Visit (INDEPENDENT_AMBULATORY_CARE_PROVIDER_SITE_OTHER): Payer: BC Managed Care – PPO

## 2014-04-22 ENCOUNTER — Encounter: Payer: Self-pay | Admitting: Family Medicine

## 2014-04-22 ENCOUNTER — Ambulatory Visit (INDEPENDENT_AMBULATORY_CARE_PROVIDER_SITE_OTHER): Payer: BC Managed Care – PPO | Admitting: Family Medicine

## 2014-04-22 VITALS — BP 147/94 | HR 91 | Wt 183.0 lb

## 2014-04-22 DIAGNOSIS — M25559 Pain in unspecified hip: Secondary | ICD-10-CM

## 2014-04-22 DIAGNOSIS — M898X5 Other specified disorders of bone, thigh: Secondary | ICD-10-CM

## 2014-04-22 DIAGNOSIS — G5710 Meralgia paresthetica, unspecified lower limb: Secondary | ICD-10-CM | POA: Diagnosis not present

## 2014-04-22 DIAGNOSIS — M948X9 Other specified disorders of cartilage, unspecified sites: Secondary | ICD-10-CM

## 2014-04-22 DIAGNOSIS — M25551 Pain in right hip: Secondary | ICD-10-CM

## 2014-04-22 DIAGNOSIS — H6120 Impacted cerumen, unspecified ear: Secondary | ICD-10-CM | POA: Diagnosis not present

## 2014-04-22 DIAGNOSIS — H6123 Impacted cerumen, bilateral: Secondary | ICD-10-CM

## 2014-04-22 DIAGNOSIS — G5711 Meralgia paresthetica, right lower limb: Secondary | ICD-10-CM

## 2014-04-22 NOTE — Progress Notes (Signed)
CC: Aaron Crawford is a 53 y.o. male is here for Cerumen Impaction   Subjective: HPI:  Patient complains of bilateral hearing loss but has been slowly worsening over the last month described as difficulty hearing noises at all frequencies. Currently moderate in severity. Accompanied by itching in both ears without pain in either ear. No other motor or sensory disturbances other than that described below. No interventions as of yet but symptoms are described as prior episodes of cerumen impactions. Denies headaches, fevers, chills, tinnitus nor dizziness.  Patient complains of right hip pain is described as a tingling and numbness with a pins and needles sensation on the anterior hip that radiates just slightly down the anterolateral thigh for a few inches. It is worse when lying down at night nothing particularly makes it better or worse other than that. He denies weakness nor any other motor sensory disturbances in the extremities. He's concerned that this could be related to a sclerotic and lytic lesion incidentally seen on a CT scan in 2012 and 13. Family history is significant for bone cancer in his mother. Denies groin pain.    Review Of Systems Outlined In HPI  Past Medical History  Diagnosis Date  . Hyperlipidemia   . Hernia     No past surgical history on file. Family History  Problem Relation Age of Onset  . Alcohol abuse Mother   . Cancer Mother   . Cancer Father   . Heart disease Father   . Diabetes Sister     History   Social History  . Marital Status: Married    Spouse Name: [name]    Number of Children: 4  . Years of Education: 11   Occupational History  . carpenter/ musician     self employed   Social History Main Topics  . Smoking status: Former Smoker    Quit date: 02/22/1989  . Smokeless tobacco: Never Used  . Alcohol Use: 1.8 oz/week    3 Cans of beer per week  . Drug Use: No  . Sexual Activity: Yes    Birth Control/ Protection: None   Other Topics  Concern  . Not on file   Social History Narrative  . No narrative on file     Objective: BP 147/94  Pulse 91  Wt 183 lb (83.008 kg)  General: Alert and Oriented, No Acute Distress HEENT: Pupils equal, round, reactive to light. Conjunctivae clear.  External ears unremarkable. On initial exam there is a bilateral cerumen impaction a moderate to severe severity on both external canals.  After removal with irrigation and curettage Canals clear with intact TMs with appropriate landmarks.  Middle ear appears open without effusion. Pink inferior turbinates.  Moist mucous membranes, pharynx without inflammation nor lesions.  Neck supple without palpable lymphadenopathy nor abnormal masses. Lungs: Clear comfortable work of breathing Cardiac: Regular rate and rhythm Extremities: No peripheral edema.  Strong peripheral pulses. Full range of motion strength in both lower extremities.  Gait normal. Mental Status: No depression, anxiety, nor agitation. Skin: Warm and dry.  Assessment & Plan: Maston was seen today for cerumen impaction.  Diagnoses and associated orders for this visit:  Right hip pain - DG Pelvis 1-2 Views; Future  Lytic bone lesion of hip - DG Pelvis 1-2 Views; Future  Cerumen impaction, bilateral  Meralgia paresthetica of right side    Discussed benign nature of meralgia paresthetica it is typically resolves with avoiding tight fitting pains or belts and with stretching.  He wants  to make sure this is been no related to a bone abnormality seen on prior CT scans therefore plain films of the pelvis have been ordered to ensure stability.  Following irritation and curettage patient had complete resolution of his hearing loss and states that hearing is back to baseline  Indication: Cerumen impaction of the ears Medical necessity statement: On physical examination, cerumen impairs clinically significant portions of the external auditory canal, and tympanic membrane. Noted  obstructive, copious cerumen that cannot be removed without magnification and instrumentations requiring physician skills Consent: Discussed benefits and risks of procedure and verbal consent obtained Procedure: Patient was prepped for the procedure. Utilized an otoscope to assess and take note of the ear canal, the tympanic membrane, and the presence, amount, and placement of the cerumen. Gentle water irrigation and soft plastic curette was utilized to remove cerumen.  Post procedure examination: shows cerumen was completely removed. Patient tolerated procedure well. The patient is made aware that they may experience temporary vertigo, temporary hearing loss, and temporary discomfort. If these symptom last for more than 24 hours to call the clinic or proceed to the ED.       Return if symptoms worsen or fail to improve.

## 2014-05-01 ENCOUNTER — Ambulatory Visit (INDEPENDENT_AMBULATORY_CARE_PROVIDER_SITE_OTHER): Payer: BC Managed Care – PPO | Admitting: Family Medicine

## 2014-05-01 ENCOUNTER — Encounter: Payer: Self-pay | Admitting: Family Medicine

## 2014-05-01 VITALS — BP 135/90 | HR 114 | Ht 72.0 in | Wt 184.0 lb

## 2014-05-01 DIAGNOSIS — E785 Hyperlipidemia, unspecified: Secondary | ICD-10-CM

## 2014-05-01 DIAGNOSIS — Z125 Encounter for screening for malignant neoplasm of prostate: Secondary | ICD-10-CM

## 2014-05-01 DIAGNOSIS — Z131 Encounter for screening for diabetes mellitus: Secondary | ICD-10-CM

## 2014-05-01 DIAGNOSIS — Z1211 Encounter for screening for malignant neoplasm of colon: Secondary | ICD-10-CM

## 2014-05-01 DIAGNOSIS — Z0000 Encounter for general adult medical examination without abnormal findings: Secondary | ICD-10-CM | POA: Diagnosis not present

## 2014-05-01 NOTE — Patient Instructions (Signed)
DASH Eating Plan DASH stands for "Dietary Approaches to Stop Hypertension." The DASH eating plan is a healthy eating plan that has been shown to reduce high blood pressure (hypertension). Additional health benefits may include reducing the risk of type 2 diabetes mellitus, heart disease, and stroke. The DASH eating plan may also help with weight loss. WHAT DO I NEED TO KNOW ABOUT THE DASH EATING PLAN? For the DASH eating plan, you will follow these general guidelines:  Choose foods with a percent daily value for sodium of less than 5% (as listed on the food label).  Use salt-free seasonings or herbs instead of table salt or sea salt.  Check with your health care provider or pharmacist before using salt substitutes.  Eat lower-sodium products, often labeled as "lower sodium" or "no salt added."  Eat fresh foods.  Eat more vegetables, fruits, and low-fat dairy products.  Choose whole grains. Look for the word "whole" as the first word in the ingredient list.  Choose fish and skinless chicken or turkey more often than red meat. Limit fish, poultry, and meat to 6 oz (170 g) each day.  Limit sweets, desserts, sugars, and sugary drinks.  Choose heart-healthy fats.  Limit cheese to 1 oz (28 g) per day.  Eat more home-cooked food and less restaurant, buffet, and fast food.  Limit fried foods.  Cook foods using methods other than frying.  Limit canned vegetables. If you do use them, rinse them well to decrease the sodium.  When eating at a restaurant, ask that your food be prepared with less salt, or no salt if possible. WHAT FOODS CAN I EAT? Seek help from a dietitian for individual calorie needs. Grains Whole grain or whole wheat bread. Brown rice. Whole grain or whole wheat pasta. Quinoa, bulgur, and whole grain cereals. Low-sodium cereals. Corn or whole wheat flour tortillas. Whole grain cornbread. Whole grain crackers. Low-sodium crackers. Vegetables Fresh or frozen vegetables  (raw, steamed, roasted, or grilled). Low-sodium or reduced-sodium tomato and vegetable juices. Low-sodium or reduced-sodium tomato sauce and paste. Low-sodium or reduced-sodium canned vegetables.  Fruits All fresh, canned (in natural juice), or frozen fruits. Meat and Other Protein Products Ground beef (85% or leaner), grass-fed beef, or beef trimmed of fat. Skinless chicken or turkey. Ground chicken or turkey. Pork trimmed of fat. All fish and seafood. Eggs. Dried beans, peas, or lentils. Unsalted nuts and seeds. Unsalted canned beans. Dairy Low-fat dairy products, such as skim or 1% milk, 2% or reduced-fat cheeses, low-fat ricotta or cottage cheese, or plain low-fat yogurt. Low-sodium or reduced-sodium cheeses. Fats and Oils Tub margarines without trans fats. Light or reduced-fat mayonnaise and salad dressings (reduced sodium). Avocado. Safflower, olive, or canola oils. Natural peanut or almond butter. Other Unsalted popcorn and pretzels. The items listed above may not be a complete list of recommended foods or beverages. Contact your dietitian for more options. WHAT FOODS ARE NOT RECOMMENDED? Grains White bread. White pasta. White rice. Refined cornbread. Bagels and croissants. Crackers that contain trans fat. Vegetables Creamed or fried vegetables. Vegetables in a cheese sauce. Regular canned vegetables. Regular canned tomato sauce and paste. Regular tomato and vegetable juices. Fruits Dried fruits. Canned fruit in light or heavy syrup. Fruit juice. Meat and Other Protein Products Fatty cuts of meat. Ribs, chicken wings, bacon, sausage, bologna, salami, chitterlings, fatback, hot dogs, bratwurst, and packaged luncheon meats. Salted nuts and seeds. Canned beans with salt. Dairy Whole or 2% milk, cream, half-and-half, and cream cheese. Whole-fat or sweetened yogurt. Full-fat   cheeses or blue cheese. Nondairy creamers and whipped toppings. Processed cheese, cheese spreads, or cheese  curds. Condiments Onion and garlic salt, seasoned salt, table salt, and sea salt. Canned and packaged gravies. Worcestershire sauce. Tartar sauce. Barbecue sauce. Teriyaki sauce. Soy sauce, including reduced sodium. Steak sauce. Fish sauce. Oyster sauce. Cocktail sauce. Horseradish. Ketchup and mustard. Meat flavorings and tenderizers. Bouillon cubes. Hot sauce. Tabasco sauce. Marinades. Taco seasonings. Relishes. Fats and Oils Butter, stick margarine, lard, shortening, ghee, and bacon fat. Coconut, palm kernel, or palm oils. Regular salad dressings. Other Pickles and olives. Salted popcorn and pretzels. The items listed above may not be a complete list of foods and beverages to avoid. Contact your dietitian for more information. WHERE CAN I FIND MORE INFORMATION? National Heart, Lung, and Blood Institute: travelstabloid.com Document Released: 08/31/2011 Document Revised: 01/26/2014 Document Reviewed: 07/16/2013 [facility] Patient Information 2015 [place], [place]. This information is not intended to replace advice given to you by your health care provider. Make sure you discuss any questions you have with your health care provider.    Dr. Lajoyce Lauber General Advice Following Your Complete Physical Exam  The Benefits of Regular Exercise: Unless you suffer from an uncontrolled cardiovascular condition, studies strongly suggest that regular exercise and physical activity will add to both the quality and length of your life.  The World Health Organization recommends 150 minutes of moderate intensity aerobic activity every week.  This is best split over 3-4 days a week, and can be as simple as a brisk walk for just over 35 minutes "most days of the week".  This type of exercise has been shown to lower LDL-Cholesterol, lower average blood sugars, lower blood pressure, lower cardiovascular disease risk, improve memory, and increase one's overall sense of wellbeing.  The addition  of anaerobic (or "strength training") exercises offers additional benefits including but not limited to increased metabolism, prevention of osteoporosis, and improved overall cholesterol levels.  How Can I Strive For A Low-Fat Diet?: Current guidelines recommend that 25-35 percent of your daily energy (food) intake should come from fats.  One might ask how can this be achieved without having to dissect each meal on a daily basis?  Switch to skim or 1% milk instead of whole milk.  Focus on lean meats such as ground [place], fresh fish, baked chicken, and lean cuts of beef as your source of dietary protein.  Limit saturated fat consumption to less than 10% of your daily caloric intake.  Limit trans fatty acid consumption primarily by limiting synthetic trans fats such as partially hydrogenated oils (Ex: fried fast foods).  Substitute olive or vegetable oil for solid fats where possible.  Moderation of Salt Intake: Provided you don't carry a diagnosis of congestive heart failure nor renal failure, I recommend a daily allowance of no more than 2300 mg of salt (sodium).  Keeping under this daily goal is associated with a decreased risk of cardiovascular events, creeping above it can lead to elevated blood pressures and increases your risk of cardiovascular events.  Milligrams (mg) of salt is listed on all nutrition labels, and your daily intake can add up faster than you think.  Most canned and frozen dinners can pack in over half your daily salt allowance in one meal.    Lifestyle Health Risks: Certain lifestyle choices carry specific health risks.  As you may already know, tobacco use has been associated with increasing one's risk of cardiovascular disease, pulmonary disease, numerous cancers, among many other issues.  What you may  not know is that there are medications and nicotine replacement strategies that can more than double your chances of successfully quitting.  I would be thrilled to help manage  your quitting strategy if you currently use tobacco products.  When it comes to alcohol use, I've yet to find an "ideal" daily allowance.  Provided an individual does not have a medical condition that is exacerbated by alcohol consumption, general guidelines determine "safe drinking" as no more than two standard drinks for a man or no more than one standard drink for a male per day.  However, much debate still exists on whether any amount of alcohol consumption is technically "safe".  My general advice, keep alcohol consumption to a minimum for general health promotion.  If you or others believe that alcohol, tobacco, or recreational drug use is interfering with your life, I would be happy to provide confidential counseling regarding treatment options.  General "Over The Counter" Nutrition Advice: Postmenopausal women should aim for a daily calcium intake of 1200 mg, however a significant portion of this might already be provided by diets including milk, yogurt, cheese, and other dairy products.  Vitamin D has been shown to help preserve bone density, prevent fatigue, and has even been shown to help reduce falls in the elderly.  Ensuring a daily intake of 800 Units of Vitamin D is a good place to start to enjoy the above benefits, we can easily check your Vitamin D level to see if you'd potentially benefit from supplementation beyond 800 Units a day.  Folic Acid intake should be of particular concern to women of childbearing age.  Daily consumption of 539-767 mcg of Folic Acid is recommended to minimize the chance of spinal cord defects in a fetus should pregnancy occur.    For many adults, accidents still remain one of the most common culprits when it comes to cause of death.  Some of the simplest but most effective preventitive habits you can adopt include regular seatbelt use, proper helmet use, securing firearms, and regularly testing your smoke and carbon monoxide detectors.  Bela Bonaparte B. Tyn[address], [place], [place] [address] Phone: [phone]

## 2014-05-01 NOTE — Progress Notes (Signed)
CC: Aaron Crawford is a 53 y.o. male is here for Annual Exam   Subjective: HPI:  Colonoscopy: Has never had a colonoscopy referral was placed today Prostate: Discussed screening risks/beneifts with patient during today's visit, he has a family history of prostate cancer in his father and has elected to have a PSA done today  Influenza Vaccine:  I encouraged him to have this once the vaccine is available this season Pneumovax: No current indication Td/Tdap: 2012 Td Zoster: (Start 53 yo)  Rare alcohol use no tobacco or recreational drug use. No formal exercise routine  Review of Systems - General ROS: negative for - chills, fever, night sweats, weight gain or weight loss Ophthalmic ROS: negative for - decreased vision Psychological ROS: negative for - anxiety or depression ENT ROS: negative for - hearing change, nasal congestion, tinnitus or allergies Hematological and Lymphatic ROS: negative for - bleeding problems, bruising or swollen lymph nodes Breast ROS: negative Respiratory ROS: no cough, shortness of breath, or wheezing Cardiovascular ROS: no chest pain or dyspnea on exertion Gastrointestinal ROS: no abdominal pain, change in bowel habits, or black or bloody stools Genito-Urinary ROS: negative for - genital discharge, genital ulcers, incontinence or abnormal bleeding from genitals Musculoskeletal ROS: negative for - joint pain or muscle pain Neurological ROS: negative for - headaches or memory loss Dermatological ROS: negative for lumps, mole changes, rash and skin lesion changes  Past Medical History  Diagnosis Date  . Hyperlipidemia   . Hernia     No past surgical history on file. Family History  Problem Relation Age of Onset  . Alcohol abuse Mother   . Cancer Mother   . Cancer Father   . Heart disease Father   . Diabetes Sister     History   Social History  . Marital Status: Married    Spouse Name: Aaron Crawford    Number of Children: 4  . Years of Education: 11    Occupational History  . carpenter/ musician     self employed   Social History Main Topics  . Smoking status: Former Smoker    Quit date: 02/22/1989  . Smokeless tobacco: Never Used  . Alcohol Use: 1.8 oz/week    3 Cans of beer per week  . Drug Use: No  . Sexual Activity: Yes    Birth Control/ Protection: None   Other Topics Concern  . Not on file   Social History Narrative  . No narrative on file     Objective: BP 135/90  Crawford 114  Ht 6' (1.829 m)  Wt 184 lb (83.462 kg)  BMI 24.95 kg/m2  General: No Acute Distress HEENT: Atraumatic, normocephalic, conjunctivae normal without scleral icterus.  No nasal discharge, hearing grossly intact, TMs with good landmarks bilaterally with no middle ear abnormalities, posterior pharynx clear without oral lesions. Neck: Supple, trachea midline, no cervical nor supraclavicular adenopathy. Pulmonary: Clear to auscultation bilaterally without wheezing, rhonchi, nor rales. Cardiac: Regular rate and rhythm.  No murmurs, rubs, nor gallops. No peripheral edema.  2+ peripheral pulses bilaterally. Abdomen: Bowel sounds normal.  No masses.  Non-tender without rebound.  Negative Murphy's sign. GU: Bilateral descended testes without palpable inguinal hernia MSK: Grossly intact, no signs of weakness.  Full strength throughout upper and lower extremities.  Full ROM in upper and lower extremities.  No midline spinal tenderness. Neuro: Gait unremarkable, CN II-XII grossly intact.  C5-C6 Reflex 2/4 Bilaterally, L4 Reflex 2/4 Bilaterally.  Cerebellar function intact. Skin: No rashes. Psych: Alert and oriented  to person/place/time.  Thought process normal. No anxiety/depression.  Assessment & Plan: Aaron Crawford was seen today for annual exam.  Diagnoses and associated orders for this visit:  Annual physical exam  Hyperlipemia - Lipid panel  Diabetes mellitus screening - BASIC METABOLIC PANEL WITH GFR  Prostate cancer screening - PSA  Special  screening for malignant neoplasms, colon - Ambulatory referral to Gastroenterology    Healthy lifestyle interventions including but not limited to regular exercise, a healthy low fat diet, moderation of salt intake, the dangers of tobacco/alcohol/recreational drug use, nutrition supplementation, and accident avoidance were discussed with the patient and a handout was provided for future reference. Due for annual lipids Due for routine diabetic screening   Return if symptoms worsen or fail to improve.

## 2014-05-02 LAB — BASIC METABOLIC PANEL WITH GFR
BUN: 9 mg/dL (ref 6–23)
CALCIUM: 9.8 mg/dL (ref 8.4–10.5)
CO2: 27 mEq/L (ref 19–32)
CREATININE: 0.71 mg/dL (ref 0.50–1.35)
Chloride: 103 mEq/L (ref 96–112)
GFR, Est Non African American: >89 mL/min
GLUCOSE: 97 mg/dL (ref 70–99)
Potassium: 4.7 mEq/L (ref 3.5–5.3)
SODIUM: 140 meq/L (ref 135–145)

## 2014-05-02 LAB — LIPID PANEL
Cholesterol: 211 mg/dL — ABNORMAL HIGH (ref 0–200)
HDL: 55 mg/dL (ref >39)
LDL CALC: 126 mg/dL — AB (ref 0–99)
Total CHOL/HDL Ratio: 3.8 Ratio
Triglycerides: 151 mg/dL — ABNORMAL HIGH (ref <150)
VLDL: 30 mg/dL (ref 0–40)

## 2014-05-02 LAB — PSA: PSA: 1.01 ng/mL (ref <=4.00)

## 2014-05-29 ENCOUNTER — Encounter: Payer: Self-pay | Admitting: Family Medicine

## 2014-10-30 ENCOUNTER — Encounter: Payer: Self-pay | Admitting: Sports Medicine

## 2014-10-30 ENCOUNTER — Ambulatory Visit (INDEPENDENT_AMBULATORY_CARE_PROVIDER_SITE_OTHER): Payer: BLUE CROSS/BLUE SHIELD | Admitting: Sports Medicine

## 2014-10-30 VITALS — BP 134/81 | HR 62 | Ht 74.0 in | Wt 184.0 lb

## 2014-10-30 DIAGNOSIS — H6120 Impacted cerumen, unspecified ear: Secondary | ICD-10-CM

## 2014-10-30 DIAGNOSIS — H6123 Impacted cerumen, bilateral: Secondary | ICD-10-CM | POA: Insufficient documentation

## 2014-10-30 DIAGNOSIS — H918X9 Other specified hearing loss, unspecified ear: Secondary | ICD-10-CM | POA: Diagnosis not present

## 2014-10-30 NOTE — Assessment & Plan Note (Signed)
Removal by physician with curettage bilaterally, no irrigation needed.

## 2014-10-30 NOTE — Progress Notes (Signed)
  Subjective:    CC: Hearing loss  HPI: This is a very pleasant 54 year old male with a history of bilateral cerumen impactions, he comes in with worsening hearing as well as balance, and thinks he has a re-impaction, symptoms are moderate, persistent, typically he gets irrigated.  Past medical history, Surgical history, Family history not pertinant except as noted below, Social history, Allergies, and medications have been entered into the medical record, reviewed, and no changes needed.   Review of Systems: No fevers, chills, night sweats, weight loss, chest pain, or shortness of breath.   Objective:    General: Well Developed, well nourished, and in no acute distress.  Neuro: Alert and oriented x3, extra-ocular muscles intact, sensation grossly intact.  HEENT: Normocephalic, atraumatic, pupils equal round reactive to light, neck supple, no masses, no lymphadenopathy, thyroid nonpalpable. Occlusion of both external canals with cerumen. Skin: Warm and dry, no rashes. Cardiac: Regular rate and rhythm, no murmurs rubs or gallops, no lower extremity edema.  Respiratory: Clear to auscultation bilaterally. Not using accessory muscles, speaking in full sentences.  Indication: Cerumen impaction of both ear(s) Medical necessity statement: On physical examination, cerumen impairs clinically significant portions of the external auditory canal, and tympanic membrane. Noted obstructive, copious cerumen that cannot be removed without magnification and instrumentations requiring physician skills Consent: Discussed benefits and risks of procedure and verbal consent obtained Procedure: Patient was prepped for the procedure. Utilized an otoscope to assess and take note of the ear canal, the tympanic membrane, and the presence, amount, and placement of the cerumen. Gentle water irrigation and soft plastic curette was utilized to remove cerumen.  Post procedure examination: shows cerumen was completely  removed. Patient tolerated procedure well. The patient is made aware that they may experience temporary vertigo, temporary hearing loss, and temporary discomfort. If these symptom last for more than 24 hours to call the clinic or proceed to the ED.  Impression and Recommendations:

## 2015-02-03 ENCOUNTER — Ambulatory Visit (INDEPENDENT_AMBULATORY_CARE_PROVIDER_SITE_OTHER): Payer: BLUE CROSS/BLUE SHIELD | Admitting: Family Medicine

## 2015-02-03 ENCOUNTER — Encounter: Payer: Self-pay | Admitting: Family Medicine

## 2015-02-03 VITALS — BP 136/89 | HR 90 | Ht 74.0 in | Wt 182.0 lb

## 2015-02-03 DIAGNOSIS — Z1211 Encounter for screening for malignant neoplasm of colon: Secondary | ICD-10-CM | POA: Diagnosis not present

## 2015-02-03 DIAGNOSIS — Z0000 Encounter for general adult medical examination without abnormal findings: Secondary | ICD-10-CM | POA: Diagnosis not present

## 2015-02-03 LAB — CBC
HEMATOCRIT: 43.7 % (ref 39.0–52.0)
HEMOGLOBIN: 14.5 g/dL (ref 13.0–17.0)
MCH: 27.7 pg (ref 26.0–34.0)
MCHC: 33.2 g/dL (ref 30.0–36.0)
MCV: 83.6 fL (ref 78.0–100.0)
MPV: 10.5 fL (ref 8.6–12.4)
Platelets: 353 10*3/uL (ref 150–400)
RBC: 5.23 MIL/uL (ref 4.22–5.81)
RDW: 14.1 % (ref 11.5–15.5)
WBC: 5.7 10*3/uL (ref 4.0–10.5)

## 2015-02-03 LAB — LIPID PANEL
CHOLESTEROL: 230 mg/dL — AB (ref 0–200)
HDL: 67 mg/dL (ref >=40)
LDL CALC: 143 mg/dL — AB (ref 0–99)
TRIGLYCERIDES: 98 mg/dL (ref <150)
Total CHOL/HDL Ratio: 3.4 Ratio
VLDL: 20 mg/dL (ref 0–40)

## 2015-02-03 LAB — COMPREHENSIVE METABOLIC PANEL
ALBUMIN: 4.3 g/dL (ref 3.5–5.2)
ALT: 25 U/L (ref 0–53)
AST: 17 U/L (ref 0–37)
Alkaline Phosphatase: 62 U/L (ref 39–117)
BUN: 12 mg/dL (ref 6–23)
CHLORIDE: 103 meq/L (ref 96–112)
CO2: 26 meq/L (ref 19–32)
Calcium: 9.8 mg/dL (ref 8.4–10.5)
Creat: 0.75 mg/dL (ref 0.50–1.35)
GLUCOSE: 103 mg/dL — AB (ref 70–99)
POTASSIUM: 4.9 meq/L (ref 3.5–5.3)
Sodium: 139 mEq/L (ref 135–145)
Total Bilirubin: 0.8 mg/dL (ref 0.2–1.2)
Total Protein: 6.8 g/dL (ref 6.0–8.3)

## 2015-02-03 NOTE — Progress Notes (Signed)
CC: Aaron Crawford is a 54 y.o. male is here for Annual Exam   Subjective: HPI:  Colonoscopy:  We discussed risks and benefits of colonoscopy today,   I have advised him that a colonoscopy is the gold standard for findings cancer or precancerous lesions in the colon. He tells me that he feels the risks are  2 dangerous but he is open to the idea of cologuard, order will be faxed. Prostate: PSA today  Influenza Vaccine: I encouraged him to have this once the vaccine is available this season Pneumovax: No current indication Td/Tdap: 2012 Td up-to-date Zoster: (Start 54 yo)   requesting complete physical exam with no acute complaints  Review of Systems - General ROS: negative for - chills, fever, night sweats, weight gain or weight loss Ophthalmic ROS: negative for - decreased vision Psychological ROS: negative for - anxiety or depression ENT ROS: negative for - hearing change, nasal congestion, tinnitus or allergies Hematological and Lymphatic ROS: negative for - bleeding problems, bruising or swollen lymph nodes Breast ROS: negative Respiratory ROS: no cough, shortness of breath, or wheezing Cardiovascular ROS: no chest pain or dyspnea on exertion Gastrointestinal ROS: no abdominal pain, change in bowel habits, or black or bloody stools Genito-Urinary ROS: negative for - genital discharge, genital ulcers, incontinence or abnormal bleeding from genitals Musculoskeletal ROS: negative for - joint pain or muscle pain Neurological ROS: negative for - headaches or memory loss Dermatological ROS: negative for lumps, mole changes, rash and skin lesion changes  Past Medical History  Diagnosis Date  . Hyperlipidemia   . Hernia     No past surgical history on file. Family History  Problem Relation Age of Onset  . Alcohol abuse Mother   . Cancer Mother   . Cancer Father   . Heart disease Father   . Diabetes Sister     History   Social History  . Marital Status: Married    Spouse  Name: [name]  . Number of Children: 4  . Years of Education: 11   Occupational History  . carpenter/ musician     self employed   Social History Main Topics  . Smoking status: Former Smoker    Quit date: 02/22/1989  . Smokeless tobacco: Never Used  . Alcohol Use: 1.8 oz/week    3 Cans of beer per week  . Drug Use: No  . Sexual Activity: Yes    Birth Control/ Protection: None   Other Topics Concern  . Not on file   Social History Narrative     Objective: BP 136/89 mmHg  Pulse 90  Ht 6' 2"  (1.88 m)  Wt 182 lb (82.555 kg)  BMI 23.36 kg/m2  General: No Acute Distress HEENT: Atraumatic, normocephalic, conjunctivae normal without scleral icterus.  No nasal discharge, hearing grossly intact, TMs with good landmarks bilaterally with no middle ear abnormalities, posterior pharynx clear without oral lesions. Neck: Supple, trachea midline, no cervical nor supraclavicular adenopathy. Pulmonary: Clear to auscultation bilaterally without wheezing, rhonchi, nor rales. Cardiac: Regular rate and rhythm.  No murmurs, rubs, nor gallops. No peripheral edema.  2+ peripheral pulses bilaterally. Abdomen: Bowel sounds normal.  No masses.  Non-tender without rebound.  Negative Murphy's sign. MSK: Grossly intact, no signs of weakness.  Full strength throughout upper and lower extremities.  Full ROM in upper and lower extremities.  No midline spinal tenderness. Neuro: Gait unremarkable, CN II-XII grossly intact.  C5-C6 Reflex 2/4 Bilaterally, L4 Reflex 2/4 Bilaterally.  Cerebellar function intact. Skin: No rashes.  Psych: Alert and oriented to person/place/time.  Thought process normal. No anxiety/depression.  Assessment & Plan: Aaron Crawford was seen today for annual exam.  Diagnoses and all orders for this visit:  Annual physical exam Orders: -     PSA -     Lipid panel -     CBC -     Comp Met (CMET)  Special screening for malignant neoplasms, colon   Healthy lifestyle interventions including  but not limited to regular exercise, a healthy low fat diet, moderation of salt intake, the dangers of tobacco/alcohol/recreational drug use, nutrition supplementation, and accident avoidance were discussed with the patient and a handout was provided for future reference.  Return if symptoms worsen or fail to improve.

## 2015-02-03 NOTE — Patient Instructions (Signed)
Dr. Edgard Debord's General Advice Following Your Complete Physical Exam  The Benefits of Regular Exercise: Unless you suffer from an uncontrolled cardiovascular condition, studies strongly suggest that regular exercise and physical activity will add to both the quality and length of your life.  The World Health Organization recommends 150 minutes of moderate intensity aerobic activity every week.  This is best split over 3-4 days a week, and can be as simple as a brisk walk for just over 35 minutes "most days of the week".  This type of exercise has been shown to lower LDL-Cholesterol, lower average blood sugars, lower blood pressure, lower cardiovascular disease risk, improve memory, and increase one's overall sense of wellbeing.  The addition of anaerobic (or "strength training") exercises offers additional benefits including but not limited to increased metabolism, prevention of osteoporosis, and improved overall cholesterol levels.  How Can I Strive For A Low-Fat Diet?: Current guidelines recommend that 25-35 percent of your daily energy (food) intake should come from fats.  One might ask how can this be achieved without having to dissect each meal on a daily basis?  Switch to skim or 1% milk instead of whole milk.  Focus on lean meats such as ground turkey, fresh fish, baked chicken, and lean cuts of beef as your source of dietary protein.  Limit saturated fat consumption to less than 10% of your daily caloric intake.  Limit trans fatty acid consumption primarily by limiting synthetic trans fats such as partially hydrogenated oils (Ex: fried fast foods).  Substitute olive or vegetable oil for solid fats where possible.  Moderation of Salt Intake: Provided you don't carry a diagnosis of congestive heart failure nor renal failure, I recommend a daily allowance of no more than 2300 mg of salt (sodium).  Keeping under this daily goal is associated with a decreased risk of cardiovascular events, creeping  above it can lead to elevated blood pressures and increases your risk of cardiovascular events.  Milligrams (mg) of salt is listed on all nutrition labels, and your daily intake can add up faster than you think.  Most canned and frozen dinners can pack in over half your daily salt allowance in one meal.    Lifestyle Health Risks: Certain lifestyle choices carry specific health risks.  As you may already know, tobacco use has been associated with increasing one's risk of cardiovascular disease, pulmonary disease, numerous cancers, among many other issues.  What you may not know is that there are medications and nicotine replacement strategies that can more than double your chances of successfully quitting.  I would be thrilled to help manage your quitting strategy if you currently use tobacco products.  When it comes to alcohol use, I've yet to find an "ideal" daily allowance.  Provided an individual does not have a medical condition that is exacerbated by alcohol consumption, general guidelines determine "safe drinking" as no more than two standard drinks for a man or no more than one standard drink for a male per day.  However, much debate still exists on whether any amount of alcohol consumption is technically "safe".  My general advice, keep alcohol consumption to a minimum for general health promotion.  If you or others believe that alcohol, tobacco, or recreational drug use is interfering with your life, I would be happy to provide confidential counseling regarding treatment options.  General "Over The Counter" Nutrition Advice: Postmenopausal women should aim for a daily calcium intake of 1200 mg, however a significant portion of this might already be   provided by diets including milk, yogurt, cheese, and other dairy products.  Vitamin D has been shown to help preserve bone density, prevent fatigue, and has even been shown to help reduce falls in the elderly.  Ensuring a daily intake of 800 Units of  Vitamin D is a good place to start to enjoy the above benefits, we can easily check your Vitamin D level to see if you'd potentially benefit from supplementation beyond 800 Units a day.  Folic Acid intake should be of particular concern to women of childbearing age.  Daily consumption of 400-800 mcg of Folic Acid is recommended to minimize the chance of spinal cord defects in a fetus should pregnancy occur.    For many adults, accidents still remain one of the most common culprits when it comes to cause of death.  Some of the simplest but most effective preventitive habits you can adopt include regular seatbelt use, proper helmet use, securing firearms, and regularly testing your smoke and carbon monoxide detectors.  Aaron Janes B. Roemello Speyer DO [facility] [address], [address] [place], [place] [address] Phone: [phone]  

## 2015-02-04 LAB — PSA: PSA: 0.6 ng/mL (ref <=4.00)

## 2015-02-17 LAB — COLOGUARD

## 2015-02-26 LAB — COLOGUARD

## 2015-04-10 ENCOUNTER — Encounter: Payer: Self-pay | Admitting: Family Medicine

## 2015-04-12 ENCOUNTER — Telehealth: Payer: Self-pay | Admitting: Family Medicine

## 2015-04-12 NOTE — Telephone Encounter (Signed)
Aaron Crawford, Can you please contact "Bree" at [phone] who is a [employer] rep and see what would be the best way to request the result from this patient's sample.  He tells me he sent it in two months ago and I haven't seen any results faxed to me yet.

## 2015-04-12 NOTE — Telephone Encounter (Signed)
Called and left a message for [facility]

## 2015-04-15 NOTE — Telephone Encounter (Signed)
Pt results are negative, the lab will urgently faxed this over to our office

## 2015-04-19 NOTE — Telephone Encounter (Signed)
Message left on vm 

## 2015-04-19 NOTE — Telephone Encounter (Signed)
Aaron Crawford, Will you please update patient about these results.

## 2015-05-07 ENCOUNTER — Encounter: Payer: Self-pay | Admitting: Family Medicine

## 2015-07-07 ENCOUNTER — Encounter: Payer: Self-pay | Admitting: Family Medicine

## 2015-08-18 ENCOUNTER — Encounter: Payer: Self-pay | Admitting: Emergency Medicine

## 2015-09-15 ENCOUNTER — Ambulatory Visit (INDEPENDENT_AMBULATORY_CARE_PROVIDER_SITE_OTHER): Payer: BLUE CROSS/BLUE SHIELD | Admitting: Sports Medicine

## 2015-09-15 ENCOUNTER — Encounter: Payer: Self-pay | Admitting: Sports Medicine

## 2015-09-15 VITALS — BP 140/94 | HR 97 | Temp 97.9°F | Resp 18 | Wt 193.0 lb

## 2015-09-15 DIAGNOSIS — H6120 Impacted cerumen, unspecified ear: Secondary | ICD-10-CM | POA: Diagnosis not present

## 2015-09-15 DIAGNOSIS — H6123 Impacted cerumen, bilateral: Secondary | ICD-10-CM

## 2015-09-15 DIAGNOSIS — H918X9 Other specified hearing loss, unspecified ear: Secondary | ICD-10-CM | POA: Diagnosis not present

## 2015-09-15 NOTE — Assessment & Plan Note (Signed)
Bilateral cerumen removal as above, return as needed.

## 2015-09-15 NOTE — Progress Notes (Signed)
  Subjective:    CC: Hearing loss  HPI: This is a very pleasant 54 year old male with a history of bilateral cerumen impactions, he comes in with worsening hearing as well as balance, and thinks he has a re-impaction, symptoms are moderate, persistent, last time I was able to remove the impactions with a curet, and he desires repeat treatment today.  Past medical history, Surgical history, Family history not pertinant except as noted below, Social history, Allergies, and medications have been entered into the medical record, reviewed, and no changes needed.   Review of Systems: No fevers, chills, night sweats, weight loss, chest pain, or shortness of breath.   Objective:    General: Well Developed, well nourished, and in no acute distress.  Neuro: Alert and oriented x3, extra-ocular muscles intact, sensation grossly intact.  HEENT: Normocephalic, atraumatic, pupils equal round reactive to light, neck supple, no masses, no lymphadenopathy, thyroid nonpalpable. Occlusion of both external canals with cerumen. Skin: Warm and dry, no rashes. Cardiac: Regular rate and rhythm, no murmurs rubs or gallops, no lower extremity edema.  Respiratory: Clear to auscultation bilaterally. Not using accessory muscles, speaking in full sentences.  Indication: Cerumen impaction of both ear(s) Medical necessity statement: On physical examination, cerumen impairs clinically significant portions of the external auditory canal, and tympanic membrane. Noted obstructive, copious cerumen that cannot be removed without magnification and instrumentations requiring physician skills Consent: Discussed benefits and risks of procedure and verbal consent obtained Procedure: Patient was prepped for the procedure. Utilized an otoscope to assess and take note of the ear canal, the tympanic membrane, and the presence, amount, and placement of the cerumen. Gentle water irrigation and soft plastic curette was utilized to remove  cerumen.  Post procedure examination: shows cerumen was completely removed. Patient tolerated procedure well. The patient is made aware that they may experience temporary vertigo, temporary hearing loss, and temporary discomfort. If these symptom last for more than 24 hours to call the clinic or proceed to the ED.  Impression and Recommendations:

## 2016-04-17 ENCOUNTER — Ambulatory Visit (INDEPENDENT_AMBULATORY_CARE_PROVIDER_SITE_OTHER): Payer: BLUE CROSS/BLUE SHIELD

## 2016-04-17 ENCOUNTER — Ambulatory Visit (INDEPENDENT_AMBULATORY_CARE_PROVIDER_SITE_OTHER): Payer: BLUE CROSS/BLUE SHIELD | Admitting: Sports Medicine

## 2016-04-17 ENCOUNTER — Encounter: Payer: Self-pay | Admitting: Sports Medicine

## 2016-04-17 DIAGNOSIS — Z0389 Encounter for observation for other suspected diseases and conditions ruled out: Secondary | ICD-10-CM | POA: Diagnosis not present

## 2016-04-17 DIAGNOSIS — M25562 Pain in left knee: Secondary | ICD-10-CM | POA: Insufficient documentation

## 2016-04-17 DIAGNOSIS — M1712 Unilateral primary osteoarthritis, left knee: Secondary | ICD-10-CM | POA: Insufficient documentation

## 2016-04-17 MED ORDER — MELOXICAM 15 MG PO TABS: ORAL_TABLET | ORAL | 3 refills | Status: DC

## 2016-04-17 NOTE — Progress Notes (Signed)
   Subjective:    I'm seeing this patient as a consultation for:    CC: L knee pain   HPI: 55 yo presenting with one month of L knee pain.  He says the pain is worse when he is going up and down stairs and kneeling.  He says he can't remember specifically when the knee pain started but it has been waxing and waning for the past month.  It is not worse in the morning.  He denies any popping or catching in his knee.  No other symptoms - pain does not radiate.  He does not have a history of knee surgery.     Past medical history, Surgical history, Family history not pertinant except as noted below, Social history, Allergies, and medications have been entered into the medical record, reviewed, and no changes needed.   Review of Systems: No headache, visual changes, nausea, vomiting, diarrhea, constipation, dizziness, abdominal pain, skin rash, fevers, chills, night sweats, weight loss, swollen lymph nodes, body aches, muscle aches, chest pain, shortness of breath, mood changes, visual or auditory hallucinations.   Objective:   General: Well Developed, well nourished, and in no acute distress.  Neuro/Psych: Alert and oriented x3, extra-ocular muscles intact, able to move all 4 extremities, sensation grossly intact. Skin: Warm and dry, no rashes noted. Two 43mm rough, dry, white papules on bilateral forearms.  Respiratory: Not using accessory muscles, speaking in full sentences, trachea midline.  Cardiovascular: Pulses palpable, no extremity edema. Abdomen: Does not appear distended. Left Knee: Normal to inspection with no erythema or obvious bony abnormalities. Mild effusion.   Palpation normal with no warmth, patellar tenderness, or condyle tenderness.  Tenderness over medial joint line.   ROM full in flexion and extension and lower leg rotation. Ligaments with solid consistent endpoints including ACL, PCL, LCL, MCL. Positive Mcmurray's sign  Negative Apley's, and Thessalonian tests. Non  painful patellar compression. Patellar glide without crepitus. Patellar and quadriceps tendons unremarkable. Hamstring and quadriceps strength is normal.   Procedure: Real-time Ultrasound Guided Injection of left knee Device: GE Logiq E  Verbal informed consent obtained.  Time-out conducted.  Noted no overlying erythema, induration, or other signs of local infection.  Skin prepped in a sterile fashion.  Local anesthesia: Topical Ethyl chloride.  With sterile technique and under real time ultrasound guidance:  1 mL kenalog 40, 2 mL lidocaine, 2 mL Marcaine injected easily. Completed without difficulty  Pain immediately resolved suggesting accurate placement of the medication.  Advised to call if fevers/chills, erythema, induration, drainage, or persistent bleeding.  Images permanently stored and available for review in the ultrasound unit.  Impression: Technically successful ultrasound guided injection. Impression and Recommendations:   This case required medical decision making of moderate complexity.  Left medial meniscus tear.  -Will inject steroids today and get L knee XR  -Will prescribe Meloxicam  -Referral to PT -Follow-up in 4-6 weeks.  Will get MRI at that time if symptoms have not resolved.   AKs on bilateral forearms (one on each arm)  -Will schedule follow-up next week for punch biopsy

## 2016-04-17 NOTE — Assessment & Plan Note (Signed)
Most likely a medial meniscal tear. Injection, physical therapy, x-rays, meloxicam, return in one month, MRI for arthroscopy planning if no better.

## 2016-04-19 ENCOUNTER — Ambulatory Visit (INDEPENDENT_AMBULATORY_CARE_PROVIDER_SITE_OTHER): Payer: BLUE CROSS/BLUE SHIELD | Admitting: Sports Medicine

## 2016-04-19 ENCOUNTER — Encounter: Payer: Self-pay | Admitting: Sports Medicine

## 2016-04-19 DIAGNOSIS — L989 Disorder of the skin and subcutaneous tissue, unspecified: Secondary | ICD-10-CM | POA: Diagnosis not present

## 2016-04-19 DIAGNOSIS — L570 Actinic keratosis: Secondary | ICD-10-CM | POA: Diagnosis not present

## 2016-04-19 DIAGNOSIS — B078 Other viral warts: Secondary | ICD-10-CM | POA: Diagnosis not present

## 2016-04-19 NOTE — Addendum Note (Signed)
Addended by: Elizabeth Sauer on: 04/19/2016 10:07 AM   Modules accepted: Orders

## 2016-04-19 NOTE — Progress Notes (Signed)
  Procedure:  Excision of right forearm AK 1cm Risks, benefits, and alternatives explained and consent obtained. Time out conducted. Surface prepped with alcohol. 1cc lidocaine with epinephine infiltrated in a field block. Adequate anesthesia ensured. Area prepped and draped in a sterile fashion. Excision performed with: 6 mm punch biopsy, followed by wound closure with a horizontal mattress 3-0 Ethilon. Hemostasis achieved. Pt stable.  Procedure:  Excision of left forearm AK 1cm Risks, benefits, and alternatives explained and consent obtained. Time out conducted. Surface prepped with alcohol. 1cc lidocaine with epinephine infiltrated in a field block. Adequate anesthesia ensured. Area prepped and draped in a sterile fashion. Excision performed with: 6 mm punch biopsy, followed by wound closure with a horizontal mattress 3-0 Ethilon. Hemostasis achieved. Pt stable.

## 2016-04-19 NOTE — Assessment & Plan Note (Signed)
Punch biopsy of left and right forearm lesions that appear to be actinic keratoses. Return in one week for suture removal.

## 2016-04-26 ENCOUNTER — Encounter: Payer: Self-pay | Admitting: Sports Medicine

## 2016-04-26 ENCOUNTER — Ambulatory Visit (INDEPENDENT_AMBULATORY_CARE_PROVIDER_SITE_OTHER): Payer: BLUE CROSS/BLUE SHIELD | Admitting: Sports Medicine

## 2016-04-26 DIAGNOSIS — L570 Actinic keratosis: Secondary | ICD-10-CM | POA: Diagnosis not present

## 2016-04-26 DIAGNOSIS — L989 Disorder of the skin and subcutaneous tissue, unspecified: Secondary | ICD-10-CM

## 2016-04-26 MED ORDER — DOXYCYCLINE HYCLATE 100 MG PO TABS: 100.0000 mg | ORAL_TABLET | Freq: Two times a day (BID) | ORAL | 0 refills | Status: AC

## 2016-04-26 NOTE — Assessment & Plan Note (Signed)
Right-sided lesion was a verruca, left-sided lesion was an actinic keratosis. We did another cryotherapy on another lesion on his left forearm that appeared to be an actinic keratosis. Sutures removed, there is a bit of erythema at the incision on the left side, so I'm going to add a few days of doxycycline.

## 2016-04-26 NOTE — Progress Notes (Signed)
  Subjective:    CC: Follow-up  HPI: Skin lesions: Sutures will be removed, there is a bit of erythema on the left side.  New skin lesion: Left-sided, just proximal to the previous actinic keratosis.  Past medical history, Surgical history, Family history not pertinant except as noted below, Social history, Allergies, and medications have been entered into the medical record, reviewed, and no changes needed.   Review of Systems: No fevers, chills, night sweats, weight loss, chest pain, or shortness of breath.   Objective:    General: Well Developed, well nourished, and in no acute distress.  Neuro: Alert and oriented x3, extra-ocular muscles intact, sensation grossly intact.  HEENT: Normocephalic, atraumatic, pupils equal round reactive to light, neck supple, no masses, no lymphadenopathy, thyroid nonpalpable.  Skin: Warm and dry, no rashes. Incisions are clean dry and intact on the right side, slight erythema on the left side. Sutures are removed. There appears to be another small actinic keratosis on his left forearm more proximally. Cardiac: Regular rate and rhythm, no murmurs rubs or gallops, no lower extremity edema.  Respiratory: Clear to auscultation bilaterally. Not using accessory muscles, speaking in full sentences.  Procedure:  Cryodestruction of  Consent obtained and verified. Time-out conducted. Noted no overlying erythema, induration, or other signs of local infection. Completed without difficulty using Cryo-Gun. Advised to call if fevers/chills, erythema, induration, drainage, or persistent bleeding.  Impression and Recommendations:    Benign skin lesion of forearm Right-sided lesion was a verruca, left-sided lesion was an actinic keratosis. We did another cryotherapy on another lesion on his left forearm that appeared to be an actinic keratosis. Sutures removed, there is a bit of erythema at the incision on the left side, so I'm going to add a few days of  doxycycline.

## 2016-04-28 ENCOUNTER — Encounter: Payer: Self-pay | Admitting: Sports Medicine

## 2016-05-15 ENCOUNTER — Ambulatory Visit: Payer: BLUE CROSS/BLUE SHIELD | Admitting: Sports Medicine

## 2016-07-06 ENCOUNTER — Ambulatory Visit (INDEPENDENT_AMBULATORY_CARE_PROVIDER_SITE_OTHER): Payer: BLUE CROSS/BLUE SHIELD

## 2016-07-06 ENCOUNTER — Encounter: Payer: Self-pay | Admitting: Sports Medicine

## 2016-07-06 ENCOUNTER — Ambulatory Visit (INDEPENDENT_AMBULATORY_CARE_PROVIDER_SITE_OTHER): Payer: BLUE CROSS/BLUE SHIELD | Admitting: Sports Medicine

## 2016-07-06 DIAGNOSIS — M25562 Pain in left knee: Secondary | ICD-10-CM | POA: Diagnosis not present

## 2016-07-06 DIAGNOSIS — S83242D Other tear of medial meniscus, current injury, left knee, subsequent encounter: Secondary | ICD-10-CM

## 2016-07-06 DIAGNOSIS — M23222 Derangement of posterior horn of medial meniscus due to old tear or injury, left knee: Secondary | ICD-10-CM

## 2016-07-06 NOTE — Progress Notes (Signed)
  Subjective:    CC: Follow-up  HPI: This is a pleasant 55 year old male, he's had greater than 6 weeks of knee pain, we saw him over a month ago, x-rays were essentially negative, injected his knee, symptoms were consistent with meniscal tear at that time. Unfortunately he has continued to have pain, moderate, persistent, localized at the medial joint line without radiation, worse with terminal flexion.  Past medical history:  Negative.  See flowsheet/record as well for more information.  Surgical history: Negative.  See flowsheet/record as well for more information.  Family history: Negative.  See flowsheet/record as well for more information.  Social history: Negative.  See flowsheet/record as well for more information.  Allergies, and medications have been entered into the medical record, reviewed, and no changes needed.   Review of Systems: No fevers, chills, night sweats, weight loss, chest pain, or shortness of breath.   Objective:    General: Well Developed, well nourished, and in no acute distress.  Neuro: Alert and oriented x3, extra-ocular muscles intact, sensation grossly intact.  HEENT: Normocephalic, atraumatic, pupils equal round reactive to light, neck supple, no masses, no lymphadenopathy, thyroid nonpalpable.  Skin: Warm and dry, no rashes. Cardiac: Regular rate and rhythm, no murmurs rubs or gallops, no lower extremity edema.  Respiratory: Clear to auscultation bilaterally. Not using accessory muscles, speaking in full sentences. Left Knee: Normal to inspection with no erythema or effusion or obvious bony abnormalities. Palpation normal with no warmth or joint line tenderness or patellar tenderness or condyle tenderness. ROM normal in flexion and extension and lower leg rotation. Ligaments with solid consistent endpoints including ACL, PCL, LCL, MCL. Positive McMurray's test with pain Non painful patellar compression. Patellar and quadriceps tendons  unremarkable. Hamstring and quadriceps strength is normal.  Impression and Recommendations:    Left knee pain Pain with greater than 6 weeks duration, failed conservative measures including injection. 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 I do suspect he will need knee arthroscopy, we are ordering proceed with MRI today.

## 2016-07-06 NOTE — Assessment & Plan Note (Signed)
Pain with greater than 6 weeks duration, failed conservative measures including injection. 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 I do suspect he will need knee arthroscopy, we are ordering proceed with MRI today.

## 2016-07-17 DIAGNOSIS — M25562 Pain in left knee: Secondary | ICD-10-CM | POA: Diagnosis not present

## 2016-07-18 DIAGNOSIS — M9902 Segmental and somatic dysfunction of thoracic region: Secondary | ICD-10-CM | POA: Diagnosis not present

## 2016-07-18 DIAGNOSIS — M436 Torticollis: Secondary | ICD-10-CM | POA: Diagnosis not present

## 2016-07-18 DIAGNOSIS — M9901 Segmental and somatic dysfunction of cervical region: Secondary | ICD-10-CM | POA: Diagnosis not present

## 2016-07-18 DIAGNOSIS — M542 Cervicalgia: Secondary | ICD-10-CM | POA: Diagnosis not present

## 2017-01-29 DIAGNOSIS — M542 Cervicalgia: Secondary | ICD-10-CM | POA: Diagnosis not present

## 2017-01-29 DIAGNOSIS — M436 Torticollis: Secondary | ICD-10-CM | POA: Diagnosis not present

## 2017-01-29 DIAGNOSIS — M9902 Segmental and somatic dysfunction of thoracic region: Secondary | ICD-10-CM | POA: Diagnosis not present

## 2017-01-29 DIAGNOSIS — M9901 Segmental and somatic dysfunction of cervical region: Secondary | ICD-10-CM | POA: Diagnosis not present

## 2017-02-02 ENCOUNTER — Encounter: Payer: Self-pay | Admitting: Sports Medicine

## 2017-02-02 ENCOUNTER — Ambulatory Visit (INDEPENDENT_AMBULATORY_CARE_PROVIDER_SITE_OTHER): Payer: BLUE CROSS/BLUE SHIELD | Admitting: Sports Medicine

## 2017-02-02 DIAGNOSIS — M79672 Pain in left foot: Secondary | ICD-10-CM

## 2017-02-02 DIAGNOSIS — H6123 Impacted cerumen, bilateral: Secondary | ICD-10-CM | POA: Insufficient documentation

## 2017-02-02 NOTE — Assessment & Plan Note (Signed)
Bilateral cerumen removal with instrumentation by physician.

## 2017-02-02 NOTE — Assessment & Plan Note (Signed)
Discontinue barefoot walking, plantar fascia rehabilitation exercises. This is not plantar fasciitis, it is a spring ligament sprain. If no improvement we will consider custom orthotics.

## 2017-02-02 NOTE — Progress Notes (Signed)
  Subjective:    CC: Multiple issues  HPI: Hearing loss: Bilateral, feels fullness in the ears.  Left foot pain: Localized at the arch, distal to the plantar fascia, worse after long days on his feet. Not worse in the morning. He does a lot of barefoot walking in the house. Symptoms are moderate, persistent, no radiation.  Past medical history:  Negative.  See flowsheet/record as well for more information.  Surgical history: Negative.  See flowsheet/record as well for more information.  Family history: Negative.  See flowsheet/record as well for more information.  Social history: Negative.  See flowsheet/record as well for more information.  Allergies, and medications have been entered into the medical record, reviewed, and no changes needed.   Review of Systems: No fevers, chills, night sweats, weight loss, chest pain, or shortness of breath.   Objective:    General: Well Developed, well nourished, and in no acute distress.  Neuro: Alert and oriented x3, extra-ocular muscles intact, sensation grossly intact.  HEENT: Normocephalic, atraumatic, pupils equal round reactive to light, neck supple, no masses, no lymphadenopathy, thyroid nonpalpable. Oropharynx, nasopharynx unremarkable, both external canals are occluded with cerumen Skin: Warm and dry, no rashes. Cardiac: Regular rate and rhythm, no murmurs rubs or gallops, no lower extremity edema.  Respiratory: Clear to auscultation bilaterally. Not using accessory muscles, speaking in full sentences. Left Foot: No visible erythema or swelling. Range of motion is full in all directions. Strength is 5/5 in all directions. No hallux valgus. No pes cavus or pes planus. No abnormal callus noted. No pain over the navicular prominence, or base of fifth metatarsal. No tenderness to palpation of the calcaneal insertion of plantar fascia. No pain at the Achilles insertion. No pain over the calcaneal bursa. No pain of the retrocalcaneal  bursa. No tenderness to palpation over the tarsals, metatarsals, or phalanges. No hallux rigidus or limitus. No tenderness palpation over interphalangeal joints. No pain with compression of the metatarsal heads. Neurovascularly intact distally.  Indication: Cerumen impaction of the left and right ear(s) Medical necessity statement: On physical examination, cerumen impairs clinically significant portions of the external auditory canal, and tympanic membrane. Noted obstructive, copious cerumen that cannot be removed without magnification and instrumentations requiring physician skills Consent: Discussed benefits and risks of procedure and verbal consent obtained Procedure: Patient was prepped for the procedure. Utilized an otoscope to assess and take note of the ear canal, the tympanic membrane, and the presence, amount, and placement of the cerumen. Gentle water irrigation and soft plastic curette was utilized to remove cerumen.  Post procedure examination: shows cerumen was completely removed. Patient tolerated procedure well. The patient is made aware that they may experience temporary vertigo, temporary hearing loss, and temporary discomfort. If these symptom last for more than 24 hours to call the clinic or proceed to the ED.  Impression and Recommendations:    Bilateral hearing loss due to cerumen impaction Bilateral cerumen removal with instrumentation by physician.  Foot arch pain, left Discontinue barefoot walking, plantar fascia rehabilitation exercises. This is not plantar fasciitis, it is a spring ligament sprain. If no improvement we will consider custom orthotics.

## 2017-02-22 DIAGNOSIS — M9901 Segmental and somatic dysfunction of cervical region: Secondary | ICD-10-CM | POA: Diagnosis not present

## 2017-02-22 DIAGNOSIS — M542 Cervicalgia: Secondary | ICD-10-CM | POA: Diagnosis not present

## 2017-02-22 DIAGNOSIS — M436 Torticollis: Secondary | ICD-10-CM | POA: Diagnosis not present

## 2017-02-22 DIAGNOSIS — M9902 Segmental and somatic dysfunction of thoracic region: Secondary | ICD-10-CM | POA: Diagnosis not present

## 2017-04-04 ENCOUNTER — Ambulatory Visit: Payer: BLUE CROSS/BLUE SHIELD

## 2017-04-04 ENCOUNTER — Encounter: Payer: Self-pay | Admitting: Family Medicine

## 2017-04-04 ENCOUNTER — Ambulatory Visit (INDEPENDENT_AMBULATORY_CARE_PROVIDER_SITE_OTHER): Payer: BLUE CROSS/BLUE SHIELD | Admitting: Family Medicine

## 2017-04-04 VITALS — BP 143/84 | HR 92 | Wt 198.0 lb

## 2017-04-04 DIAGNOSIS — I83892 Varicose veins of left lower extremities with other complications: Secondary | ICD-10-CM | POA: Diagnosis not present

## 2017-04-04 DIAGNOSIS — M79662 Pain in left lower leg: Secondary | ICD-10-CM | POA: Diagnosis not present

## 2017-04-04 DIAGNOSIS — R609 Edema, unspecified: Secondary | ICD-10-CM | POA: Diagnosis not present

## 2017-04-04 NOTE — Patient Instructions (Signed)
Thank you for coming in today. Get the ultrasound today at 2pm.  Get labs today.  We will contact you with results.  Schedule a follow up appointment with Dr Sheppard Coil to establish care in the near future.    Varicose Veins Varicose veins are veins that have become enlarged and twisted. They are usually seen in the legs but can occur in other parts of the body as well. What are the causes? This condition is the result of valves in the veins not working properly. Valves in the veins help to return blood from the leg to the heart. If these valves are damaged, blood flows backward and backs up into the veins in the leg near the skin. This causes the veins to become larger. What increases the risk? People who are on their feet a lot, who are pregnant, or who are overweight are more likely to develop varicose veins. What are the signs or symptoms?  Bulging, twisted-appearing, bluish veins, most commonly found on the legs.  Leg pain or a feeling of heaviness. These symptoms may be worse at the end of the day.  Leg swelling.  Changes in skin color. How is this diagnosed? A health care provider can usually diagnose varicose veins by examining your legs. Your health care provider may also recommend an ultrasound of your leg veins. How is this treated? Most varicose veins can be treated at home.However, other treatments are available for people who have persistent symptoms or want to improve the cosmetic appearance of the varicose veins. These treatment options include:  Sclerotherapy. A solution is injected into the vein to close it off.  Laser treatment. A laser is used to heat the vein to close it off.  Radiofrequency vein ablation. An electrical current produced by radio waves is used to close off the vein.  Phlebectomy. The vein is surgically removed through small incisions made over the varicose vein.  Vein ligation and stripping. The vein is surgically removed through incisions made  over the varicose vein after the vein has been tied (ligated).  Follow these instructions at home:  Do not stand or sit in one position for long periods of time. Do not sit with your legs crossed. Rest with your legs raised during the day.  Wear compression stockings as directed by your health care provider. These stockings help to prevent blood clots and reduce swelling in your legs.  Do not wear other tight, encircling garments around your legs, pelvis, or waist.  Walk as much as possible to increase blood flow.  Raise the foot of your bed at night with 2-inch blocks.  If you get a cut in the skin over the vein and the vein bleeds, lie down with your leg raised and press on it with a clean cloth until the bleeding stops. Then place a bandage (dressing) on the cut. See your health care provider if it continues to bleed. Contact a health care provider if:  The skin around your ankle starts to break down.  You have pain, redness, tenderness, or hard swelling in your leg over a vein.  You are uncomfortable because of leg pain. This information is not intended to replace advice given to you by your health care provider. Make sure you discuss any questions you have with your health care provider. Document Released: 06/21/2005 Document Revised: 02/17/2016 Document Reviewed: 03/14/2016 Elsevier Interactive Patient Education  2017 [employer].

## 2017-04-04 NOTE — Progress Notes (Signed)
Aaron Crawford is a 56 y.o. male who presents to [facility] today for left leg swelling and pain. Patient has a personal history of left leg varicose vein for years. He notes over the last several days it's become more swollen and now tender. He notes swelling and tenderness along the posterior aspect of his medial knee. He denies chest pain palpitations or shortness of breath and feels well otherwise. He has not tried any treatment yet. He denies any history of injury or immobilization to the left leg. He denies a personal or family history of DVT.   Past Medical History:  Diagnosis Date  . Hernia   . Hyperlipidemia    No past surgical history on file. Social History  Substance Use Topics  . Smoking status: Former Smoker    Quit date: 02/22/1989  . Smokeless tobacco: Never Used  . Alcohol use 1.8 oz/week    3 Cans of beer per week   family history includes Alcohol abuse in his mother; Cancer in his father and mother; Diabetes in his sister; Heart disease in his father.  ROS as above:  Medications: No current outpatient prescriptions on file.   No current facility-administered medications for this visit.    Allergies  Allergen Reactions  . Ambien [Zolpidem]     Panic Attack  . Penicillins   . Percocet [Oxycodone-Acetaminophen] Anxiety    Health Maintenance Health Maintenance  Topic Date Due  . Hepatitis C Screening  Aug 21, 1961  . HIV Screening  07/21/1978  . INFLUENZA VACCINE  04/25/2017  . Fecal DNA (Cologuard)  02/16/2018  . TETANUS/TDAP  07/09/2021     Exam:  BP (!) 143/84   Pulse 92   Wt 198 lb (89.8 kg)   SpO2 98%   BMI 25.42 kg/m  Gen: Well NAD HEENT: EOMI,  MMM Lungs: Normal work of breathing. CTABL Heart: RRR no MRG Abd: NABS, Soft. Nondistended, Nontender Exts: Brisk capillary refill, warm and well perfused. Varicosity present  along the medial aspect of the calf posterior knee and medial thigh consistent with a saphenous vein mildly tender to palpation. No obvious palpable Baker cyst present. Calf diameter is approximately equal to the contralateral side. Pulses capillary refill and sensation are intact distally.  No results found for this or any previous visit (from the past 72 hour(s)). No results found.    Assessment and Plan: 56 y.o. male with enlarged varicose vein with tenderness and swelling. This is somewhat concerning for DVT. Plan for ultrasound evaluation for DVT today. Additionally we will order a venous reflux study. We'll check CBC and metabolic panel for potential Eliquis safety if DVT is positive  Recommend patient follow-up with Dr. Sheppard Coil or Sherlie Ban PA-C to establish care regarding mildly elevated blood pressure.  Orders Placed This Encounter  Procedures  . US Venous Img Lower Unilateral Left    Standing Status:   Future    Number of Occurrences:   1    Standing Expiration Date:   06/05/2018    Order Specific Question:   Reason for Exam (SYMPTOM  OR DIAGNOSIS REQUIRED)    Answer:   eval left leg swelling concern DVT    Order Specific Question:   Preferred imaging location?    Answer:   [facility]  . CBC  . COMPLETE METABOLIC PANEL WITH GFR   No orders of the defined types were placed in this encounter.    Discussed warning signs  or symptoms. Please see discharge instructions. Patient expresses understanding.

## 2017-04-05 LAB — COMPLETE METABOLIC PANEL WITH GFR
ALT: 23 U/L (ref 9–46)
AST: 18 U/L (ref 10–35)
Albumin: 4.3 g/dL (ref 3.6–5.1)
Alkaline Phosphatase: 81 U/L (ref 40–115)
BILIRUBIN TOTAL: 0.5 mg/dL (ref 0.2–1.2)
BUN: 12 mg/dL (ref 7–25)
CO2: 23 mmol/L (ref 20–31)
Calcium: 9.6 mg/dL (ref 8.6–10.3)
Chloride: 103 mmol/L (ref 98–110)
Creat: 0.81 mg/dL (ref 0.70–1.33)
GFR, Est African American: >89 mL/min (ref >=60)
GLUCOSE: 97 mg/dL (ref 65–99)
POTASSIUM: 4.4 mmol/L (ref 3.5–5.3)
SODIUM: 138 mmol/L (ref 135–146)
TOTAL PROTEIN: 6.7 g/dL (ref 6.1–8.1)

## 2017-04-05 LAB — CBC
HCT: 44.6 % (ref 38.5–50.0)
Hemoglobin: 14.9 g/dL (ref 13.2–17.1)
MCH: 28.2 pg (ref 27.0–33.0)
MCHC: 33.4 g/dL (ref 32.0–36.0)
MCV: 84.5 fL (ref 80.0–100.0)
MPV: 10.3 fL (ref 7.5–12.5)
PLATELETS: 340 10*3/uL (ref 140–400)
RBC: 5.28 MIL/uL (ref 4.20–5.80)
RDW: 13.5 % (ref 11.0–15.0)
WBC: 8.4 10*3/uL (ref 3.8–10.8)

## 2017-05-07 ENCOUNTER — Ambulatory Visit (HOSPITAL_COMMUNITY)
Admission: RE | Admit: 2017-05-07 | Discharge: 2017-05-07 | Disposition: A | Discharge status: Home / Self Care | Payer: BLUE CROSS/BLUE SHIELD | Source: Ambulatory Visit | Attending: Cardiovascular Disease | Admitting: Cardiovascular Disease

## 2017-05-07 DIAGNOSIS — I83892 Varicose veins of left lower extremities with other complications: Secondary | ICD-10-CM | POA: Diagnosis not present

## 2017-05-07 DIAGNOSIS — I872 Venous insufficiency (chronic) (peripheral): Secondary | ICD-10-CM | POA: Insufficient documentation

## 2017-10-02 ENCOUNTER — Ambulatory Visit: Payer: BLUE CROSS/BLUE SHIELD | Admitting: Sports Medicine

## 2017-10-02 ENCOUNTER — Encounter: Payer: Self-pay | Admitting: Sports Medicine

## 2017-10-02 DIAGNOSIS — H6123 Impacted cerumen, bilateral: Secondary | ICD-10-CM

## 2017-10-02 DIAGNOSIS — Z789 Other specified health status: Secondary | ICD-10-CM

## 2017-10-02 DIAGNOSIS — Z87898 Personal history of other specified conditions: Secondary | ICD-10-CM | POA: Insufficient documentation

## 2017-10-02 MED ORDER — TOPIRAMATE 50 MG PO TABS: ORAL_TABLET | ORAL | 3 refills | Status: DC

## 2017-10-02 NOTE — Assessment & Plan Note (Signed)
Drinks about 4-6 beers a night. At this point we are not going to consider disulfiram or naltrexone but I am going to do Topamax.

## 2017-10-02 NOTE — Assessment & Plan Note (Signed)
Bilateral instrumentation for cerumen removal. Return as needed.

## 2017-10-02 NOTE — Progress Notes (Signed)
Subjective:    CC: Difficulty hearing  HPI: This is a pleasant 57 year old male, he comes in with a several week history of increasing difficulty hearing.  He does have a history of bilateral cerumen impactions that respond well to cerumen removal with instrumentation.  He feels as though he has recurrent impactions.  Symptoms are moderate, worsening.  Bilateral.  In addition he feels as though he is consuming too much alcohol, he works as a Radiographer, therapeutic in a band, before [employer] he will drink 4 large beers, approximately 60 ounces.  He does not tend to withdraw, answers negative to 3 the [place] questions, but does feel as though he needs some help to cut back.  I reviewed the past medical history, family history, social history, surgical history, and allergies today and no changes were needed.  Please see the problem list section below in epic for further details.  Past Medical History: Past Medical History:  Diagnosis Date  . Hernia   . Hyperlipidemia    Past Surgical History: No past surgical history on file. Social History: Social History   Socioeconomic History  . Marital status: Married    Spouse name: [name]  . Number of children: 4  . Years of education: 46  . Highest education level: None  Social Needs  . Financial resource strain: None  . Food insecurity - worry: None  . Food insecurity - inability: None  . Transportation needs - medical: None  . Transportation needs - non-medical: None  Occupational History  . 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 Smoking status: Former Smoker    Last attempt to quit: 02/22/1989    Years since quitting: 28.6  . Smokeless tobacco: Never Used  Substance and Sexual Activity  . Alcohol use: Yes    Alcohol/week: 1.8 oz    Types: 3 Cans of beer per week  . Drug use: No  . Sexual activity: Yes    Birth control/protection: None  Other Topics Concern  . None  Social History Narrative  . None   Family  History: Family History  Problem Relation Age of Onset  . Alcohol abuse Mother   . Cancer Mother   . Cancer Father   . Heart disease Father   . Diabetes Sister    Allergies: Allergies  Allergen Reactions  . Ambien [Zolpidem]     Panic Attack  . Penicillins   . Percocet [Oxycodone-Acetaminophen] Anxiety   Medications: See med rec.  Review of Systems: No fevers, chills, night sweats, weight loss, chest pain, or shortness of breath.   Objective:    General: Well Developed, well nourished, and in no acute distress.  Neuro: Alert and oriented x3, extra-ocular muscles intact, sensation grossly intact.  HEENT: Normocephalic, atraumatic, pupils equal round reactive to light, neck supple, no masses, no lymphadenopathy, thyroid nonpalpable.  Oropharynx, nasopharynx unremarkable, bilateral cerumen impactions. Skin: Warm and dry, no rashes. Cardiac: Regular rate and rhythm, no murmurs rubs or gallops, no lower extremity edema.  Respiratory: Clear to auscultation bilaterally. Not using accessory muscles, speaking in full sentences.  Indication: Cerumen impaction of the left and right ear(s) Medical necessity statement: On physical examination, cerumen impairs clinically significant portions of the external auditory canal, and tympanic membrane. Noted obstructive, copious cerumen that cannot be removed without magnification and instrumentations requiring physician skills Consent: Discussed benefits and risks of procedure and verbal consent obtained Procedure: Patient was prepped for the procedure. Utilized an otoscope to assess and  take note of the ear canal, the tympanic membrane, and the presence, amount, and placement of the cerumen.  Soft plastic curette was utilized to remove cerumen.  Post procedure examination: shows cerumen was completely removed. Patient tolerated procedure well. The patient is made aware that they may experience temporary vertigo, temporary hearing loss, and temporary  discomfort. If these symptom last for more than 24 hours to call the clinic or proceed to the ED.  Impression and Recommendations:    Bilateral hearing loss due to cerumen impaction Bilateral instrumentation for cerumen removal. Return as needed.  Alcohol consumption heavy Drinks about 4-6 beers a night. At this point we are not going to consider disulfiram or naltrexone but I am going to do Topamax.  I spent 25 minutes with this patient, greater than 50% was face-to-face time counseling regarding the above diagnoses, this was separate from the time spent performing the above procedure ___________________________________________ Gwen Her. Dianah Field, M.D., ABFM., CAQSM. Primary Care and [place] Instructor of [place] of [facility] [employer]

## 2017-11-28 ENCOUNTER — Encounter: Payer: Self-pay | Admitting: Sports Medicine

## 2017-11-28 DIAGNOSIS — Z789 Other specified health status: Secondary | ICD-10-CM

## 2017-11-28 MED ORDER — TOPIRAMATE 50 MG PO TABS: 50.0000 mg | ORAL_TABLET | Freq: Two times a day (BID) | ORAL | 3 refills | Status: DC

## 2018-02-26 ENCOUNTER — Telehealth: Payer: Self-pay

## 2018-02-26 NOTE — Telephone Encounter (Signed)
Are you the PCP for Aaron Crawford. The chart still has Dr Ileene Rubens as the PCP. [employer] faxed a record stating he is due for a Cologuard.

## 2018-02-26 NOTE — Telephone Encounter (Signed)
I'm not, but I guess it would be ok.  Ok to place the order under my name.

## 2018-03-03 ENCOUNTER — Other Ambulatory Visit: Payer: Self-pay | Admitting: Sports Medicine

## 2018-03-03 DIAGNOSIS — Z789 Other specified health status: Secondary | ICD-10-CM

## 2018-03-08 ENCOUNTER — Encounter: Payer: Self-pay | Admitting: Emergency Medicine

## 2018-03-08 ENCOUNTER — Emergency Department
Admission: EM | Admit: 2018-03-08 | Discharge: 2018-03-08 | Disposition: A | Discharge status: Home / Self Care | Payer: BLUE CROSS/BLUE SHIELD | Source: Home / Self Care | Attending: Family Medicine | Admitting: Family Medicine

## 2018-03-08 ENCOUNTER — Other Ambulatory Visit: Payer: Self-pay

## 2018-03-08 DIAGNOSIS — Z23 Encounter for immunization: Secondary | ICD-10-CM

## 2018-03-08 DIAGNOSIS — S91332A Puncture wound without foreign body, left foot, initial encounter: Secondary | ICD-10-CM

## 2018-03-08 MED ORDER — IBUPROFEN 600 MG PO TABS
600.0000 mg | ORAL_TABLET | Freq: Once | ORAL | Status: AC
  Administered 2018-03-08: 600 mg via ORAL

## 2018-03-08 MED ORDER — TETANUS-DIPHTH-ACELL PERTUSSIS 5-2.5-18.5 LF-MCG/0.5 IM SUSP
0.5000 mL | Freq: Once | INTRAMUSCULAR | Status: AC
  Administered 2018-03-08: 0.5 mL via INTRAMUSCULAR

## 2018-03-08 MED ORDER — CIPROFLOXACIN HCL 250 MG PO TABS: 250.0000 mg | ORAL_TABLET | Freq: Two times a day (BID) | ORAL | 0 refills | Status: DC

## 2018-03-08 NOTE — ED Provider Notes (Signed)
[name]    CSN: [id number] Arrival date & time: 03/08/18  1351     History   Chief Complaint Chief Complaint  Patient presents with  . Foot Injury    HPI Aaron Crawford is a 57 y.o. male.   HPI  Aaron Crawford is a 57 y.o. male presenting to UC with c/o puncture wound in his Left foot from a nail that was stuck in a piece of wood 2 hours PTA. The nail did puncture the shoe he was wearing.  He was able to easily remove the entire nail. Pain is aching and sore, moderate in severity, worse with weightbearing.  Last tetanus 2012.   Past Medical History:  Diagnosis Date  . Hernia   . Hyperlipidemia     Patient Active Problem List   Diagnosis Date Noted  . Alcohol consumption heavy 10/02/2017  . Bilateral hearing loss due to cerumen impaction 02/02/2017  . Foot arch pain, left 02/02/2017  . Benign skin lesion of forearm 04/19/2016  . Left knee pain 04/17/2016  . Bilateral inguinal hernia 09/10/2012  . Hyperlipemia 04/27/2011    History reviewed. No pertinent surgical history.     Home Medications    Prior to Admission medications   Medication Sig Start Date End Date Taking? Authorizing Provider  ciprofloxacin (CIPRO) 250 MG tablet Take 1 tablet (250 mg total) by mouth 2 (two) times daily. 03/08/18   Noe Gens, PA-C  topiramate (TOPAMAX) 50 MG tablet Take 1 tablet (50 mg total) by mouth 2 (two) times daily. 11/28/17   Silverio Decamp, MD  topiramate (TOPAMAX) 50 MG tablet ONE HALF TAB BY MOUTH AT BEDTIME FOR A WEEK, THEN ONE TAB BY MOUTH AT BEDTIME 03/04/18   Silverio Decamp, MD    Family History Family History  Problem Relation Age of Onset  . Alcohol abuse Mother   . Cancer Mother   . Cancer Father   . Heart disease Father   . Diabetes Sister     Social History Social History   Tobacco Use  . Smoking status: Former Smoker    Last attempt to quit: 02/22/1989    Years since quitting: 29.0  . Smokeless tobacco: Never Used    Substance Use Topics  . Alcohol use: Yes    Alcohol/week: 1.8 oz    Types: 3 Cans of beer per week  . Drug use: No     Allergies   Ambien [zolpidem]; Penicillins; and Percocet [oxycodone-acetaminophen]   Review of Systems Review of Systems  Musculoskeletal: Positive for myalgias. Negative for arthralgias.       Left foot  Skin: Positive for wound. Negative for color change.     Physical Exam Triage Vital Signs ED Triage Vitals  Enc Vitals Group     BP      Pulse      Resp      Temp      Temp src      SpO2      Weight      Height      Head Circumference      Peak Flow      Pain Score      Pain Loc      Pain Edu?      Excl. in [place]?    No data found.  Updated Vital Signs BP 136/81 (BP Location: Right Arm)   Pulse (!) 108   Temp 98.1 F (36.7 C) (Oral)   Resp 18  Ht 5\' 11"  (1.803 m)   Wt 198 lb (89.8 kg)   SpO2 97%   BMI 27.62 kg/m   Visual Acuity Right Eye Distance:   Left Eye Distance:   Bilateral Distance:    Right Eye Near:   Left Eye Near:    Bilateral Near:     Physical Exam  Constitutional: He is oriented to person, place, and time. He appears well-developed and well-nourished.  HENT:  Head: Normocephalic and atraumatic.  Eyes: EOM are normal.  Neck: Normal range of motion.  Cardiovascular: Normal rate.  Pulmonary/Chest: Effort normal.  Musculoskeletal: Normal range of motion. He exhibits tenderness. He exhibits no edema.  Left foot: tenderness to the ball of his foot. Full ROM all toes.  Neurological: He is alert and oriented to person, place, and time.  Skin: Skin is warm and dry. Capillary refill takes less than 2 seconds.  Left foot: plantar aspect at distal aspect of 2nd and 3rd metatarsals- puncture wound. No bleeding. No foreign bodies seen or palpated. Mildly tender.   Psychiatric: He has a normal mood and affect. His behavior is normal.  Nursing note and vitals reviewed.    UC Treatments / Results  Labs (all labs ordered  are listed, but only abnormal results are displayed) Labs Reviewed - No data to display  EKG None  Radiology No results found.  Procedures Procedures (including critical care time)  Medications Ordered in UC Medications  Tdap (BOOSTRIX) injection 0.5 mL (0.5 mLs Intramuscular Given 03/08/18 1426)  ibuprofen (ADVIL,MOTRIN) tablet 600 mg (600 mg Oral Given 03/08/18 1426)    Initial Impression / Assessment and Plan / UC Course  I have reviewed the triage vital signs and the nursing notes.  Pertinent labs & imaging results that were available during my care of the patient were reviewed by me and considered in my medical decision making (see chart for details).     Wound soaked in warm water and Hibiclens.  Pat dried. Bacitracin and bandage applied. Pt has unknown reaction to PCN as a child. Will prescribe 3 days Cipro for prophylaxis treatment.  Final Clinical Impressions(s) / UC Diagnoses   Final diagnoses:  Puncture wound to foot, left, initial encounter     Discharge Instructions      Keep wound clean with warm water and soap. You may soak foot in warm water and Epson salt or soap. Pat dry and apply and over the counter antibiotic ointment and a bandage. Please take the prescription antibiotic as prescribed to help prevent infection.  Please follow up with Dr. Dianah Field in 1 week if not improving, sooner if signs of infection- increased pain, redness, swelling, drainage or pus.      ED Prescriptions    Medication Sig Dispense Auth. Provider   ciprofloxacin (CIPRO) 250 MG tablet Take 1 tablet (250 mg total) by mouth 2 (two) times daily. 6 tablet Noe Gens, PA-C     Controlled Substance Prescriptions [place] Controlled Substance Registry consulted? Not Applicable   Tyrell Antonio 03/08/18 1546

## 2018-03-08 NOTE — Discharge Instructions (Signed)
°  Keep wound clean with warm water and soap. You may soak foot in warm water and Epson salt or soap. Pat dry and apply and over the counter antibiotic ointment and a bandage. Please take the prescription antibiotic as prescribed to help prevent infection.  Please follow up with Dr. Dianah Field in 1 week if not improving, sooner if signs of infection- increased pain, redness, swelling, drainage or pus.

## 2018-03-08 NOTE — ED Triage Notes (Signed)
Stepped on nail that pierced tennis shoe entering left fall of foot about 2 hours ago; nail was intact. Tetanus 2012.

## 2018-03-09 ENCOUNTER — Encounter: Payer: Self-pay | Admitting: Sports Medicine

## 2018-03-12 NOTE — Telephone Encounter (Signed)
Patient agreed. Form faxed. Patient advised to call insurance company to see if they will pay for the Cologuard.

## 2018-07-03 IMAGING — US US EXTREM LOW VENOUS*L*
1 series · 13 of 24 positions shown · non-contrast
Comparison: None.

CLINICAL DATA: Left lower extremity pain and edema. History of
varicose veins. Evaluate for DVT.



[Series 1: us extrem low venous*left* · 0.07mm/px · 13 of 27 slices shown]
[im 1/27]
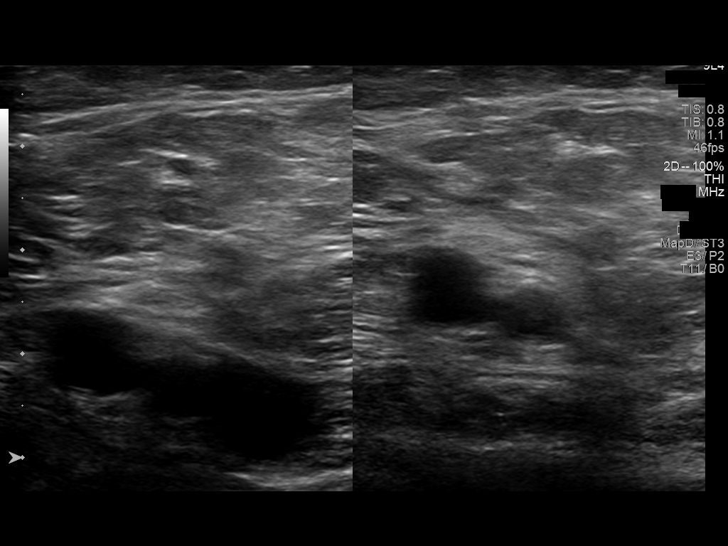
[im 3/27]
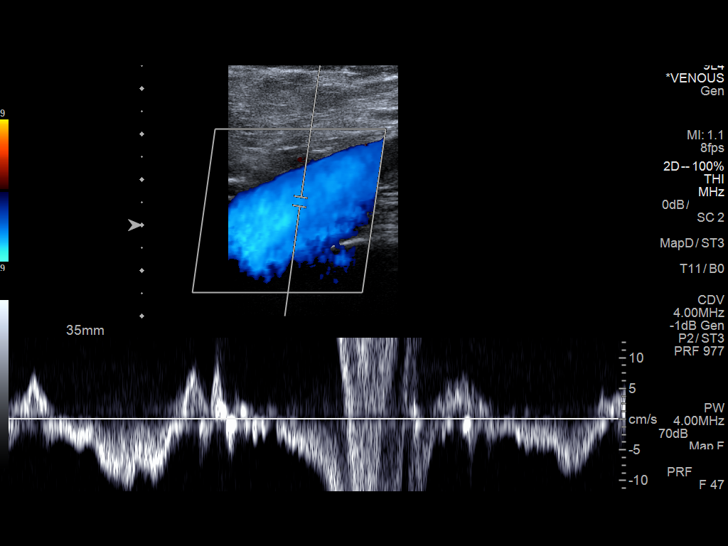
[im 5/27]
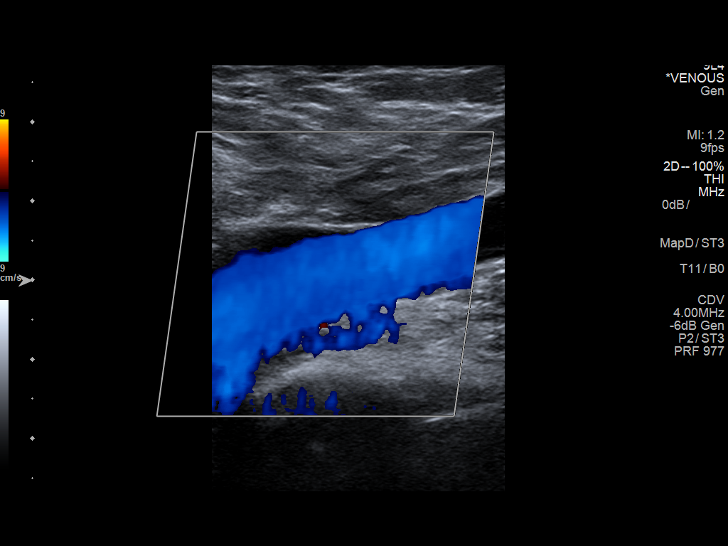
[im 7/27]
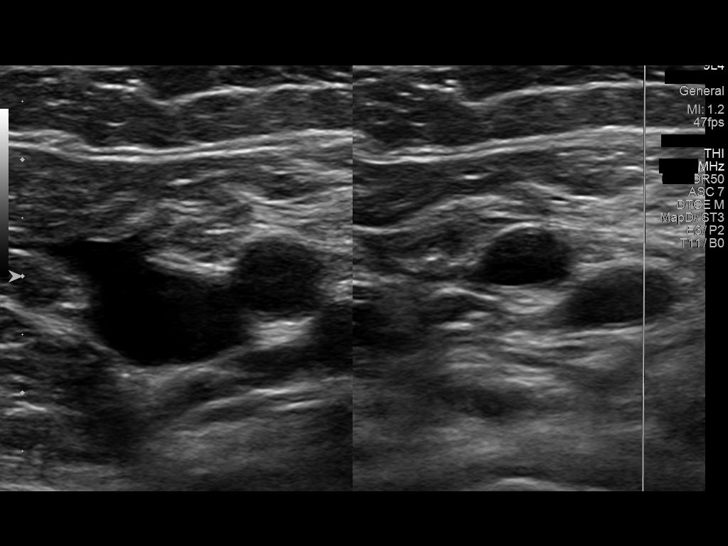
[im 10/27]
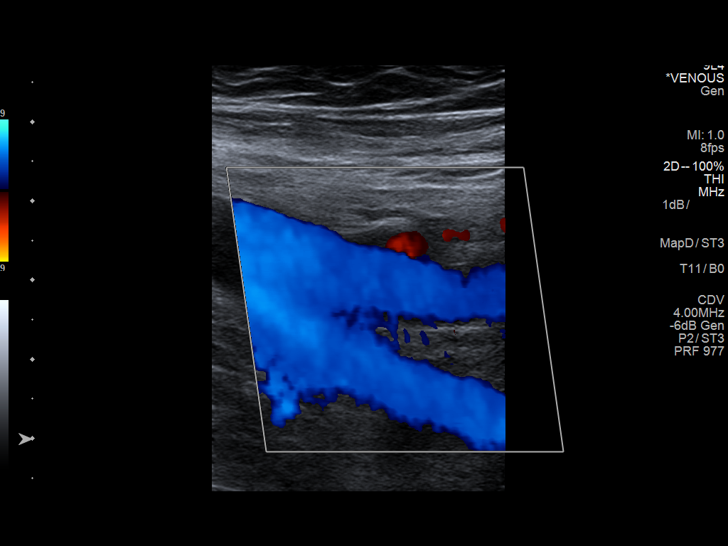
[im 12/27]
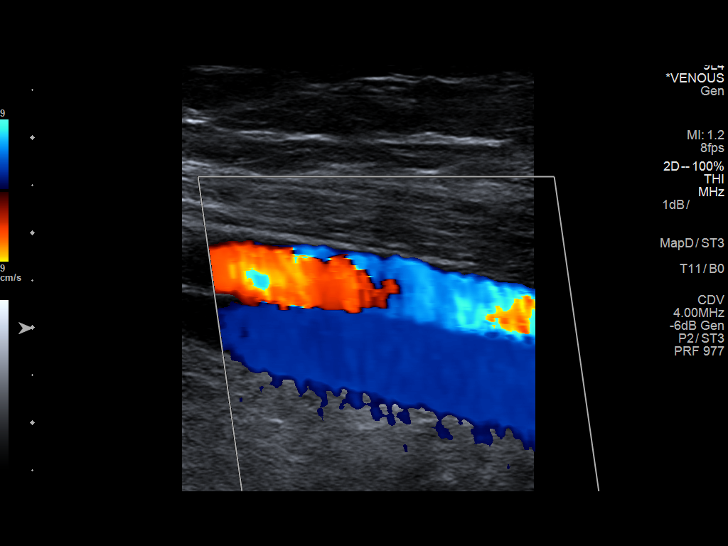
[im 14/27]
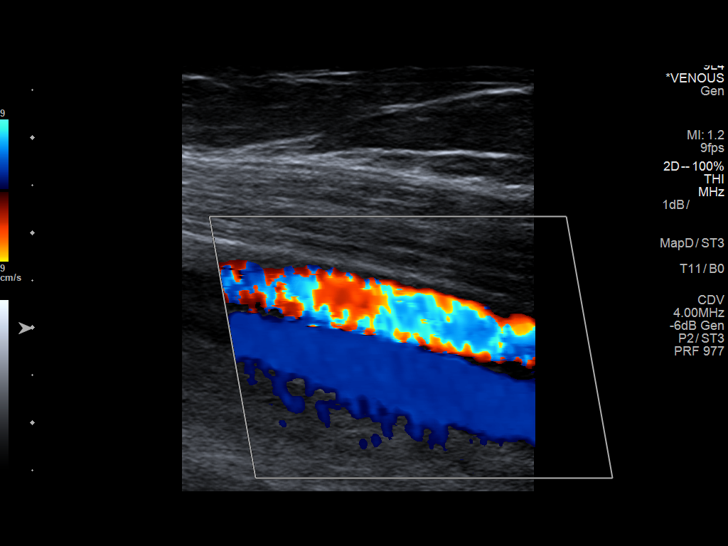
[im 15/27]
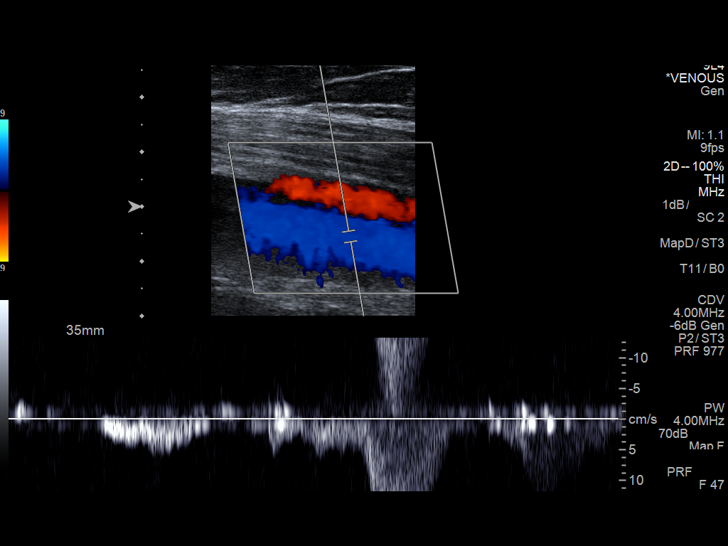
[im 17/27]
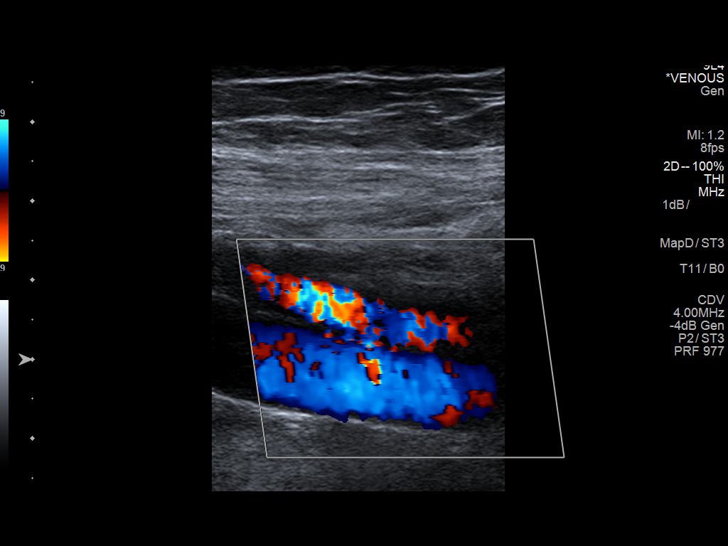
[im 20/27]
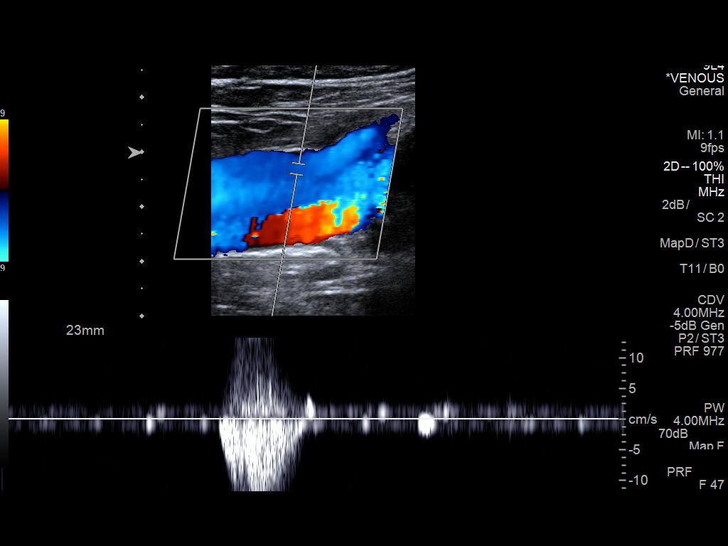
[im 22/27]
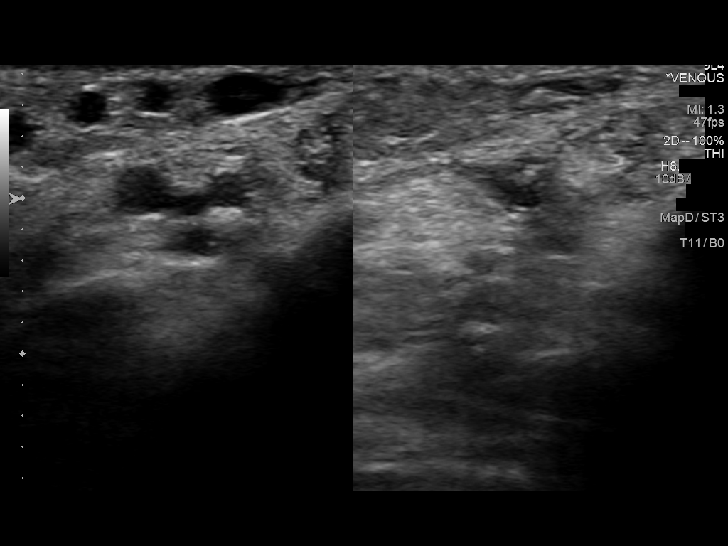
[im 24/27]
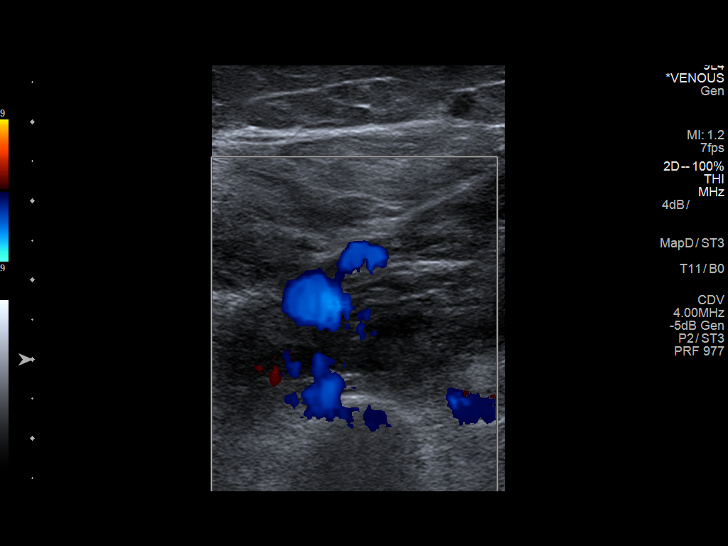
[im 27/27]
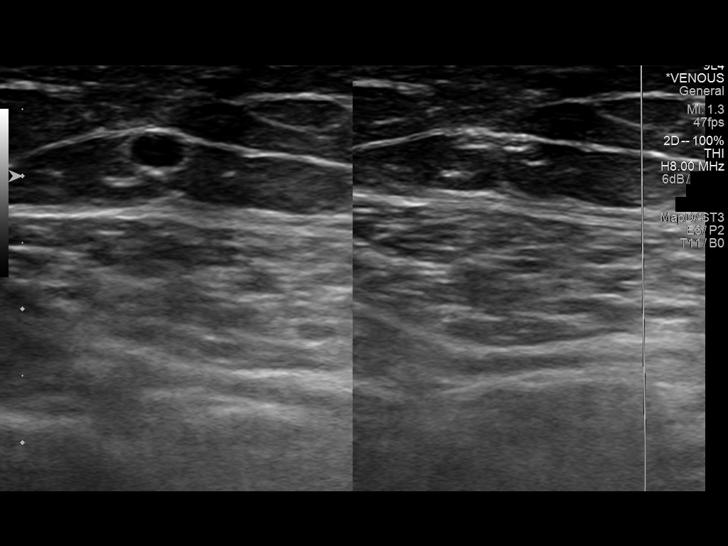

[13 of 24 positions shown; findings below may reference images not displayed]

FINDINGS: Contralateral Common Femoral Vein: Respiratory phasicity is normal
and symmetric with the symptomatic side. No evidence of thrombus.
Normal compressibility.

Common Femoral Vein: No evidence of thrombus. Normal
compressibility, respiratory phasicity and response to augmentation.

Saphenofemoral Junction: No evidence of thrombus. Normal
compressibility and flow on color Doppler imaging.

Profunda Femoral Vein: No evidence of thrombus. Normal
compressibility and flow on color Doppler imaging.

Femoral Vein: No evidence of thrombus. Normal compressibility,
respiratory phasicity and response to augmentation.

Popliteal Vein: No evidence of thrombus. Normal compressibility,
respiratory phasicity and response to augmentation.

Calf Veins: No evidence of thrombus. Normal compressibility and flow
on color Doppler imaging.

Superficial Great Saphenous Vein: No evidence of thrombus. Normal
compressibility and flow on color Doppler imaging.

Venous Reflux:  None.

Other Findings:  None.
IMPRESSION: No evidence of DVT within the left lower extremity.

## 2019-10-07 ENCOUNTER — Other Ambulatory Visit: Payer: Self-pay

## 2019-10-07 ENCOUNTER — Encounter: Payer: Self-pay | Admitting: Sports Medicine

## 2019-10-07 ENCOUNTER — Ambulatory Visit (INDEPENDENT_AMBULATORY_CARE_PROVIDER_SITE_OTHER): Payer: BC Managed Care – PPO | Admitting: Sports Medicine

## 2019-10-07 DIAGNOSIS — H6123 Impacted cerumen, bilateral: Secondary | ICD-10-CM | POA: Diagnosis not present

## 2019-10-07 DIAGNOSIS — Z789 Other specified health status: Secondary | ICD-10-CM | POA: Diagnosis not present

## 2019-10-07 DIAGNOSIS — R5383 Other fatigue: Secondary | ICD-10-CM | POA: Insufficient documentation

## 2019-10-07 NOTE — Progress Notes (Signed)
    Procedures performed today:    Indication: Cerumen impaction of the left and right ear(s) Medical necessity statement: On physical examination, cerumen impairs clinically significant portions of the external auditory canal, and tympanic membrane. Noted obstructive, copious cerumen that cannot be removed without magnification and instrumentations requiring physician skills Consent: Discussed benefits and risks of procedure and verbal consent obtained Procedure: Patient was prepped for the procedure. Utilized an otoscope to assess and take note of the ear canal, the tympanic membrane, and the presence, amount, and placement of the cerumen. Gentle water irrigation and soft plastic curette was utilized to remove cerumen.  Post procedure examination: shows cerumen was completely removed. Patient tolerated procedure well. The patient is made aware that they may experience temporary vertigo, temporary hearing loss, and temporary discomfort. If these symptom last for more than 24 hours to call the clinic or proceed to the ED.  Independent interpretation of tests performed by another provider:   None.  Impression and Recommendations:    Bilateral hearing loss due to cerumen impaction Bilateral cerumen removal with instrumentation.  Alcohol consumption heavy Alcohol consumption has decreased significantly. Approximately 3 beers per night.  Fatigue Fatigue with excessive daytime sleepiness, gasping for air. Doing a full work-up including CBC, CMP, TSH, vitamin D, testosterone, B12. Home sleep study. There is also likely some degree of dysthymia or seasonal affective disorder. He will also work on getting 30 minutes of moderate intensity exercise daily. Lastly, his mother-in-law is convalescing at his house and this has created an added stressor.    ___________________________________________ Gwen Her. Dianah Field, M.D., ABFM., CAQSM. Primary Care and [place] Instructor of [place] of [facility] of [employer]

## 2019-10-07 NOTE — Assessment & Plan Note (Signed)
Alcohol consumption has decreased significantly. Approximately 3 beers per night.

## 2019-10-07 NOTE — Assessment & Plan Note (Signed)
Fatigue with excessive daytime sleepiness, gasping for air. Doing a full work-up including CBC, CMP, TSH, vitamin D, testosterone, B12. Home sleep study. There is also likely some degree of dysthymia or seasonal affective disorder. He will also work on getting 30 minutes of moderate intensity exercise daily. Lastly, his mother-in-law is convalescing at his house and this has created an added stressor.

## 2019-10-07 NOTE — Assessment & Plan Note (Signed)
Bilateral cerumen removal with instrumentation.

## 2019-10-08 ENCOUNTER — Other Ambulatory Visit: Payer: Self-pay

## 2019-10-08 DIAGNOSIS — E785 Hyperlipidemia, unspecified: Secondary | ICD-10-CM

## 2019-10-08 DIAGNOSIS — Z0000 Encounter for general adult medical examination without abnormal findings: Secondary | ICD-10-CM

## 2019-10-08 DIAGNOSIS — R5383 Other fatigue: Secondary | ICD-10-CM

## 2019-10-08 NOTE — Telephone Encounter (Signed)
Sounds like for this patient they just need labs linked to the annual physical exam code?

## 2020-12-01 ENCOUNTER — Encounter: Payer: Self-pay | Admitting: Sports Medicine

## 2020-12-01 ENCOUNTER — Other Ambulatory Visit: Payer: Self-pay

## 2020-12-01 ENCOUNTER — Ambulatory Visit: Payer: BC Managed Care – PPO | Admitting: Sports Medicine

## 2020-12-01 VITALS — BP 119/83 | HR 106 | Ht 71.0 in | Wt 196.0 lb

## 2020-12-01 DIAGNOSIS — R5383 Other fatigue: Secondary | ICD-10-CM | POA: Diagnosis not present

## 2020-12-01 DIAGNOSIS — Z0000 Encounter for general adult medical examination without abnormal findings: Secondary | ICD-10-CM | POA: Diagnosis not present

## 2020-12-01 DIAGNOSIS — H6123 Impacted cerumen, bilateral: Secondary | ICD-10-CM | POA: Diagnosis not present

## 2020-12-01 DIAGNOSIS — L989 Disorder of the skin and subcutaneous tissue, unspecified: Secondary | ICD-10-CM

## 2020-12-01 DIAGNOSIS — N139 Obstructive and reflux uropathy, unspecified: Secondary | ICD-10-CM

## 2020-12-01 DIAGNOSIS — E785 Hyperlipidemia, unspecified: Secondary | ICD-10-CM

## 2020-12-01 NOTE — Assessment & Plan Note (Signed)
Potentially some mild depressive symptoms, he does have a seasonal component, riding his motorcycle certainly helps. He is not interested in medication or behavioral therapy at this juncture, no suicidal or homicidal ideation.

## 2020-12-01 NOTE — Progress Notes (Signed)
Subjective:    CC: Annual Physical Exam  HPI:  This patient is here for their annual physical  I reviewed the past medical history, family history, social history, surgical history, and allergies today and no changes were needed.  Please see the problem list section below in epic for further details.  Past Medical History: Past Medical History:  Diagnosis Date  . Hernia   . Hyperlipidemia    Past Surgical History: No past surgical history on file. Social History: Social History   Socioeconomic History  . Marital status: Married    Spouse name: [name]  . Number of children: 4  . Years of education: 76  . Highest education level: Not on file  Occupational History  . 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 Smoking status: Former Smoker    Quit date: 02/22/1989    Years since quitting: 31.7  . Smokeless tobacco: Never Used  Substance and Sexual Activity  . Alcohol use: Yes    Alcohol/week: 3.0 standard drinks    Types: 3 Cans of beer per week  . Drug use: No  . Sexual activity: Yes    Birth control/protection: None  Other Topics Concern  . Not on file  Social History Narrative  . Not on file   Social Determinants of Health   Financial Resource Strain: Not on file  Food Insecurity: Not on file  Transportation Needs: Not on file  Physical Activity: Not on file  Stress: Not on file  Social Connections: Not on file   Family History: Family History  Problem Relation Age of Onset  . Alcohol abuse Mother   . Cancer Mother   . Cancer Father   . Heart disease Father   . Diabetes Sister    Allergies: Allergies  Allergen Reactions  . Ambien [Zolpidem]     Panic Attack  . Penicillins     "i've always been told since I was a kid, that I am allergic, but I don't know what type of reaction."  . Percocet [Oxycodone-Acetaminophen] Anxiety   Medications: See med rec.  Review of Systems: No headache, visual changes, nausea,  vomiting, diarrhea, constipation, dizziness, abdominal pain, skin rash, fevers, chills, night sweats, swollen lymph nodes, weight loss, chest pain, body aches, joint swelling, muscle aches, shortness of breath, mood changes, visual or auditory hallucinations.  Objective:    General: Well Developed, well nourished, and in no acute distress.  Neuro: Alert and oriented x3, extra-ocular muscles intact, sensation grossly intact. Cranial nerves II through XII are intact, motor, sensory, and coordinative functions are all intact. HEENT: Normocephalic, atraumatic, pupils equal round reactive to light, neck supple, no masses, no lymphadenopathy, thyroid nonpalpable. Oropharynx, nasopharynx are unremarkable.  Left-sided cerumen impaction. Skin: Warm and dry, no rashes noted.  Cardiac: Regular rate and rhythm, no murmurs rubs or gallops.  Respiratory: Clear to auscultation bilaterally. Not using accessory muscles, speaking in full sentences.  Abdominal: Soft, nontender, nondistended, positive bowel sounds, no masses, no organomegaly.  Musculoskeletal: Shoulder, elbow, wrist, hip, knee, ankle stable, and with full range of motion.  Indication: Cerumen impaction of the left ear(s) Medical necessity statement: On physical examination, cerumen impairs clinically significant portions of the external auditory canal, and tympanic membrane. Noted obstructive, copious cerumen that cannot be removed without magnification and instrumentations requiring physician skills Consent: Discussed benefits and risks of procedure and verbal consent obtained Procedure: Patient was prepped for the procedure. Utilized an otoscope to assess and take note  of the ear canal, the tympanic membrane, and the presence, amount, and placement of the cerumen. Soft plastic curette was utilized to remove cerumen.  Post procedure examination: shows cerumen was completely removed. Patient tolerated procedure well. The patient is made aware that they  may experience temporary vertigo, temporary hearing loss, and temporary discomfort. If these symptom last for more than 24 hours to call the clinic or proceed to the ED.  Impression and Recommendations:    The patient was counselled, risk factors were discussed, anticipatory guidance given.  Skin lesion of right arm Jarion has a lesion on his right forearm. We have removed actinic keratoses and verrucae in the past. He also has a lesion on his left nipple. The lesion looks like a skin tag, forearm lesion looks like an actinic keratosis. He will make a follow-up appointment in a 15-minute slot for consideration of shave biopsy of the right forearm lesion, we can make the decision whether or not to remove the left nipple skin tag at that time.  Annual physical exam Annual physical above, checking routine labs. He tells me Cologuard is not covered so we will get him referred for colonoscopy.   Bilateral hearing loss due to cerumen impaction Cerumen impaction on the left, removed with instrumentation.  Fatigue Potentially some mild depressive symptoms, he does have a seasonal component, riding his motorcycle certainly helps. He is not interested in medication or behavioral therapy at this juncture, no suicidal or homicidal ideation.  Hyperlipemia Checking labs.   ___________________________________________ Gwen Her. Dianah Field, M.D., ABFM., CAQSM. Primary Care and Sports Medicine [facility]  Adjunct Professor of [employer] [facility] [employer] [facility] of Medicine

## 2020-12-01 NOTE — Assessment & Plan Note (Signed)
Annual physical above, checking routine labs. He tells me Cologuard is not covered so we will get him referred for colonoscopy.

## 2020-12-01 NOTE — Assessment & Plan Note (Signed)
Cerumen impaction on the left, removed with instrumentation.

## 2020-12-01 NOTE — Assessment & Plan Note (Signed)
Checking labs

## 2020-12-01 NOTE — Assessment & Plan Note (Signed)
Aaron Crawford has a lesion on his right forearm. We have removed actinic keratoses and verrucae in the past. He also has a lesion on his left nipple. The lesion looks like a skin tag, forearm lesion looks like an actinic keratosis. He will make a follow-up appointment in a 15-minute slot for consideration of shave biopsy of the right forearm lesion, we can make the decision whether or not to remove the left nipple skin tag at that time.

## 2020-12-07 ENCOUNTER — Ambulatory Visit: Payer: BC Managed Care – PPO | Admitting: Sports Medicine

## 2020-12-07 ENCOUNTER — Other Ambulatory Visit: Payer: Self-pay | Admitting: Sports Medicine

## 2020-12-07 ENCOUNTER — Other Ambulatory Visit: Payer: Self-pay

## 2020-12-07 DIAGNOSIS — L570 Actinic keratosis: Secondary | ICD-10-CM | POA: Diagnosis not present

## 2020-12-07 DIAGNOSIS — N139 Obstructive and reflux uropathy, unspecified: Secondary | ICD-10-CM | POA: Diagnosis not present

## 2020-12-07 DIAGNOSIS — E785 Hyperlipidemia, unspecified: Secondary | ICD-10-CM | POA: Diagnosis not present

## 2020-12-07 DIAGNOSIS — L989 Disorder of the skin and subcutaneous tissue, unspecified: Secondary | ICD-10-CM

## 2020-12-07 NOTE — Assessment & Plan Note (Signed)
Shave biopsy of right forearm lesion, he has had actinic keratoses improve pain in the past. He also had a lesion on his left nipple that we deferred, just looks like a skin tag. Return as needed.

## 2020-12-07 NOTE — Progress Notes (Signed)
    Procedures performed today:    Procedure: Shave biopsy of right forearm suspicious lesion, 1 cm Risks, benefits, and alternatives explained and consent obtained. Time out conducted. Surface prepped with alcohol. 2cc lidocaine with epinephine infiltrated in a field block. Adequate anesthesia ensured. Area prepped and draped in a sterile fashion. Excision performed with: Using a derma blade I made a shave into the deep dermis, I then used a Hyfrecator to achieve hemostasis. Hemostasis achieved. Pt stable.  Independent interpretation of notes and tests performed by another provider:   None.  Brief History, Exam, Impression, and Recommendations:    Skin lesion of right arm Shave biopsy of right forearm lesion, he has had actinic keratoses improve pain in the past. He also had a lesion on his left nipple that we deferred, just looks like a skin tag. Return as needed.    ___________________________________________ Gwen Her. Dianah Field, M.D., ABFM., CAQSM. Primary Care and [place] Instructor of [place] [employer] [facility] of Medicine

## 2020-12-07 NOTE — Addendum Note (Signed)
Addended by: Dema Severin on: 12/07/2020 10:33 AM   Modules accepted: Orders

## 2020-12-08 LAB — PSA, TOTAL AND FREE
PSA, % Free: 38 % (calc) (ref >25)
PSA, Free: 0.3 ng/mL
PSA, Total: 0.8 ng/mL (ref <=4.0)

## 2020-12-08 LAB — COMPREHENSIVE METABOLIC PANEL
AG Ratio: 2 (calc) (ref 1.0–2.5)
ALT: 26 U/L (ref 9–46)
AST: 18 U/L (ref 10–35)
Albumin: 4.6 g/dL (ref 3.6–5.1)
Alkaline phosphatase (APISO): 79 U/L (ref 35–144)
BUN: 10 mg/dL (ref 7–25)
CO2: 30 mmol/L (ref 20–32)
Calcium: 9.9 mg/dL (ref 8.6–10.3)
Chloride: 102 mmol/L (ref 98–110)
Creat: 0.82 mg/dL (ref 0.70–1.33)
Globulin: 2.3 g/dL (calc) (ref 1.9–3.7)
Glucose, Bld: 105 mg/dL — ABNORMAL HIGH (ref 65–99)
Potassium: 4.6 mmol/L (ref 3.5–5.3)
Sodium: 140 mmol/L (ref 135–146)
Total Bilirubin: 0.5 mg/dL (ref 0.2–1.2)
Total Protein: 6.9 g/dL (ref 6.1–8.1)

## 2020-12-08 LAB — CBC
HCT: 49.1 % (ref 38.5–50.0)
Hemoglobin: 16.3 g/dL (ref 13.2–17.1)
MCH: 27.7 pg (ref 27.0–33.0)
MCHC: 33.2 g/dL (ref 32.0–36.0)
MCV: 83.5 fL (ref 80.0–100.0)
MPV: 10.8 fL (ref 7.5–12.5)
Platelets: 375 10*3/uL (ref 140–400)
RBC: 5.88 10*6/uL — ABNORMAL HIGH (ref 4.20–5.80)
RDW: 12.7 % (ref 11.0–15.0)
WBC: 6.7 10*3/uL (ref 3.8–10.8)

## 2020-12-08 LAB — LIPID PANEL
Cholesterol: 216 mg/dL — ABNORMAL HIGH (ref <200)
HDL: 57 mg/dL (ref >=40)
LDL Cholesterol (Calc): 131 mg/dL (calc) — ABNORMAL HIGH
Non-HDL Cholesterol (Calc): 159 mg/dL (calc) — ABNORMAL HIGH (ref <130)
Total CHOL/HDL Ratio: 3.8 (calc) (ref <5.0)
Triglycerides: 167 mg/dL — ABNORMAL HIGH (ref <150)

## 2020-12-08 LAB — TSH: TSH: 0.91 mIU/L (ref 0.40–4.50)

## 2020-12-13 ENCOUNTER — Encounter: Payer: Self-pay | Admitting: Gastroenterology

## 2021-02-07 ENCOUNTER — Other Ambulatory Visit: Payer: Self-pay

## 2021-02-07 ENCOUNTER — Ambulatory Visit (AMBULATORY_SURGERY_CENTER): Payer: Self-pay

## 2021-02-07 VITALS — Ht 71.0 in | Wt 196.0 lb

## 2021-02-07 DIAGNOSIS — Z1211 Encounter for screening for malignant neoplasm of colon: Secondary | ICD-10-CM

## 2021-02-07 MED ORDER — CLENPIQ 10-3.5-12 MG-GM -GM/160ML PO SOLN: 1.0000 | Freq: Once | ORAL | 0 refills | Status: AC

## 2021-02-07 NOTE — Progress Notes (Signed)
No egg or soy allergy known to patient  No issues with past sedation with any surgeries or procedures Patient denies ever being told they had issues or difficulty with intubation  No FH of Malignant Hyperthermia No diet pills per patient No home 02 use per patient  No blood thinners per patient  Pt denies issues with constipation  No A fib or A flutter  EMMI video to pt or via MyChart  COVID 19 guidelines implemented in PV today with Pt and RN    NO PA's for preps discussed with pt In PV today  Discussed with pt there will be an out-of-pocket cost for prep and that varies from $0 to 70 dollars   Due to the COVID-19 pandemic we are asking patients to follow certain guidelines.  Pt aware of COVID protocols and [facility] guidelines   

## 2021-02-15 ENCOUNTER — Encounter: Payer: Self-pay | Admitting: Gastroenterology

## 2021-02-22 ENCOUNTER — Ambulatory Visit (AMBULATORY_SURGERY_CENTER): Payer: BC Managed Care – PPO | Admitting: Gastroenterology

## 2021-02-22 ENCOUNTER — Other Ambulatory Visit: Payer: Self-pay

## 2021-02-22 ENCOUNTER — Encounter: Payer: Self-pay | Admitting: Gastroenterology

## 2021-02-22 VITALS — BP 124/82 | HR 114 | Temp 97.3°F | Resp 12 | Ht 71.0 in | Wt 196.0 lb

## 2021-02-22 DIAGNOSIS — K640 First degree hemorrhoids: Secondary | ICD-10-CM

## 2021-02-22 DIAGNOSIS — D125 Benign neoplasm of sigmoid colon: Secondary | ICD-10-CM

## 2021-02-22 DIAGNOSIS — K5730 Diverticulosis of large intestine without perforation or abscess without bleeding: Secondary | ICD-10-CM

## 2021-02-22 DIAGNOSIS — Z1211 Encounter for screening for malignant neoplasm of colon: Secondary | ICD-10-CM | POA: Diagnosis not present

## 2021-02-22 MED ORDER — SODIUM CHLORIDE 0.9 % IV SOLN: 500.0000 mL | Freq: Once | INTRAVENOUS | Status: DC

## 2021-02-22 NOTE — Progress Notes (Signed)
pt tolerated well. VSS. awake and to recovery. Report given to RN.  

## 2021-02-22 NOTE — Patient Instructions (Signed)

## 2021-02-22 NOTE — Progress Notes (Signed)
Called to room to assist during endoscopic procedure.  Patient ID and intended procedure confirmed with present staff. Received instructions for my participation in the procedure from the performing physician.  

## 2021-02-22 NOTE — Op Note (Signed)
[place] Patient Name: Aaron Crawford Procedure Date: 02/22/2021 9:01 AM MRN: [id number] Endoscopist: Gerrit Heck , MD Age: 60 Referring MD:  Date of Birth: Aug 06, 1961 Gender: Male Account #: [id number] Procedure:                Colonoscopy Indications:              Screening for colorectal malignant neoplasm, This                            is the patient's first colonoscopy Medicines:                Monitored Anesthesia Care Procedure:                Pre-Anesthesia Assessment:                           - Prior to the procedure, a History and Physical                            was performed, and patient medications and                            allergies were reviewed. The patient's tolerance of                            previous anesthesia was also reviewed. The risks                            and benefits of the procedure and the sedation                            options and risks were discussed with the patient.                            All questions were answered, and informed consent                            was obtained. Prior Anticoagulants: The patient has                            taken no previous anticoagulant or antiplatelet                            agents. ASA Grade Assessment: II - A patient with                            mild systemic disease. After reviewing the risks                            and benefits, the patient was deemed in                            satisfactory condition to undergo the procedure.  After obtaining informed consent, the colonoscope                            was passed under direct vision. Throughout the                            procedure, the patient's blood pressure, pulse, and                            oxygen saturations were monitored continuously. The                            Olympus CF-[id number]) [id number] was introduced                            through the anus and  advanced to the the terminal                            ileum. The colonoscopy was performed without                            difficulty. The patient tolerated the procedure                            well. The quality of the bowel preparation was                            good. The terminal ileum, ileocecal valve,                            appendiceal orifice, and rectum were photographed. Scope In: 9:16:58 AM Scope Out: 9:31:38 AM Scope Withdrawal Time: 0 hours 12 minutes 41 seconds  Total Procedure Duration: 0 hours 14 minutes 40 seconds  Findings:                 The perianal and digital rectal examinations were                            normal.                           Four sessile polyps were found in the sigmoid                            colon. The polyps were 3 to 5 mm in size. These                            polyps were removed with a cold snare. Resection                            and retrieval were complete. Estimated blood loss                            was minimal. Estimated blood loss was minimal.  A few small-mouthed diverticula were found in the                            sigmoid colon.                           Non-bleeding internal hemorrhoids were found during                            retroflexion. The hemorrhoids were small.                           The terminal ileum appeared normal. Complications:            No immediate complications. Estimated Blood Loss:     Estimated blood loss was minimal. Impression:               - Four 3 to 5 mm polyps in the sigmoid colon,                            removed with a cold snare. Resected and retrieved.                           - Diverticulosis in the sigmoid colon.                           - Non-bleeding internal hemorrhoids.                           - The examined portion of the ileum was normal. Recommendation:           - Patient has a contact number available for                             emergencies. The signs and symptoms of potential                            delayed complications were discussed with the                            patient. Return to normal activities tomorrow.                            Written discharge instructions were provided to the                            patient.                           - Resume previous diet.                           - Continue present medications.                           - Await pathology results.                           -  Repeat colonoscopy for surveillance based on                            pathology results.                           - Return to GI office PRN.                           - Use fiber, for example Citrucel, Fibercon, Konsyl                            or Metamucil.                           - Internal hemorrhoids were noted on this study and                            may be amenable to hemorrhoid band ligation. If you                            are interested in further treatment of these                            hemorrhoids with band ligation, please contact my                            clinic to set up an appointment for evaluation and                            treatment. Gerrit Heck, MD 02/22/2021 9:37:02 AM

## 2021-02-22 NOTE — Progress Notes (Signed)
Vital signs checked by:  CW  The patient states no changes in medical or surgical history since pre-visit screening on  02/07/2021.

## 2021-02-24 ENCOUNTER — Telehealth: Payer: Self-pay | Admitting: *Deleted

## 2021-02-24 NOTE — Telephone Encounter (Signed)
  Follow up Call-  Call back number 02/22/2021  Post procedure Call Back phone  # [phone]  Permission to leave phone message Yes  Some recent data might be hidden     Patient questions:  Do you have a fever, pain , or abdominal swelling? No. Pain Score  0 *  Have you tolerated food without any problems? Yes.    Have you been able to return to your normal activities? Yes.    Do you have any questions about your discharge instructions: Diet   No. Medications  No. Follow up visit  No.  Do you have questions or concerns about your Care? No.  Actions: * If pain score is 4 or above: No action needed, pain <4.  1. Have you developed a fever since your procedure? no  2.   Have you had an respiratory symptoms (SOB or cough) since your procedure? no  3.   Have you tested positive for COVID 19 since your procedure no  4.   Have you had any family members/close contacts diagnosed with the COVID 19 since your procedure?  no   If yes to any of these questions please route to Joylene John, RN and Joella Prince, RN

## 2021-02-25 ENCOUNTER — Encounter: Payer: Self-pay | Admitting: Gastroenterology

## 2021-04-27 DIAGNOSIS — H43812 Vitreous degeneration, left eye: Secondary | ICD-10-CM | POA: Diagnosis not present

## 2021-04-27 DIAGNOSIS — H40013 Open angle with borderline findings, low risk, bilateral: Secondary | ICD-10-CM | POA: Diagnosis not present

## 2022-02-01 ENCOUNTER — Ambulatory Visit (INDEPENDENT_AMBULATORY_CARE_PROVIDER_SITE_OTHER): Payer: 59

## 2022-02-01 ENCOUNTER — Ambulatory Visit: Payer: 59 | Admitting: Sports Medicine

## 2022-02-01 DIAGNOSIS — M25562 Pain in left knee: Secondary | ICD-10-CM

## 2022-02-01 DIAGNOSIS — H6123 Impacted cerumen, bilateral: Secondary | ICD-10-CM | POA: Diagnosis not present

## 2022-02-01 NOTE — Assessment & Plan Note (Signed)
Very pleasant 61 year old male, chronic left knee pain, mostly medial joint line and posteriorly, MRI from 2017 did show some meniscal tearing and degenerative changes, we did inject him back in 2017 and he did well. ?Repeat injection today. ?Home conditioning given, return to see me in 4 to 6 weeks as needed. ?

## 2022-02-01 NOTE — Progress Notes (Signed)
? ? ?  Procedures performed today:   ? ?Procedure: Real-time Ultrasound Guided injection of the left knee ?Device: Samsung HS60  ?Verbal informed consent obtained.  ?Time-out conducted.  ?Noted no overlying erythema, induration, or other signs of local infection.  ?Skin prepped in a sterile fashion.  ?Local anesthesia: Topical Ethyl chloride.  ?With sterile technique and under real time ultrasound guidance: Trace effusion noted, 1 cc Kenalog 40, 2 cc lidocaine, 2 cc bupivacaine injected easily ?Completed without difficulty  ?Advised to call if fevers/chills, erythema, induration, drainage, or persistent bleeding.  ?Images permanently stored and available for review in PACS.  ?Impression: Technically successful ultrasound guided injection. ? ?Indication: Cerumen impaction of the left and right ear(s) ?Medical necessity statement: On physical examination, cerumen impairs clinically significant portions of the external auditory canal, and tympanic membrane. Noted obstructive, copious cerumen that cannot be removed without magnification and instrumentations requiring physician skills ?Consent: Discussed benefits and risks of procedure and verbal consent obtained ?Procedure: Patient was prepped for the procedure. Utilized an otoscope to assess and take note of the ear canal, the tympanic membrane, and the presence, amount, and placement of the cerumen.  Soft plastic curette was utilized to remove cerumen.  ?Post procedure examination: shows cerumen was completely removed. Patient tolerated procedure well. The patient is made aware that they may experience temporary vertigo, temporary hearing loss, and temporary discomfort. If these symptom last for more than 24 hours to call the clinic or proceed to the ED. ? ?Independent interpretation of notes and tests performed by another provider:  ? ?None. ? ?Brief History, Exam, Impression, and Recommendations:   ? ?Left knee pain ?Very pleasant 61 year old male, chronic left knee  pain, mostly medial joint line and posteriorly, MRI from 2017 did show some meniscal tearing and degenerative changes, we did inject him back in 2017 and he did well. ?Repeat injection today. ?Home conditioning given, return to see me in 4 to 6 weeks as needed. ? ?Bilateral hearing loss due to cerumen impaction ?Bilateral impactions removed with instrumentation. ? ?Chronic process with pharmacologic intervention and exacerbation. ? ?___________________________________________ ?Gwen Her. Dianah Field, M.D., ABFM., CAQSM. ?Primary Care and Sports Medicine ?[place] ? ?Adjunct Instructor of Family Medicine  ?[employer] ?

## 2022-02-01 NOTE — Assessment & Plan Note (Signed)
Bilateral impactions removed with instrumentation. ?

## 2022-03-29 ENCOUNTER — Encounter (HOSPITAL_BASED_OUTPATIENT_CLINIC_OR_DEPARTMENT_OTHER): Payer: Self-pay | Admitting: Emergency Medicine

## 2022-03-29 ENCOUNTER — Emergency Department
Admission: EM | Admit: 2022-03-29 | Discharge: 2022-03-29 | Disposition: A | Discharge status: Other Acute Inpatient Hospital | Payer: 59 | Attending: Family Medicine | Admitting: Family Medicine

## 2022-03-29 ENCOUNTER — Inpatient Hospital Stay (HOSPITAL_BASED_OUTPATIENT_CLINIC_OR_DEPARTMENT_OTHER)
Admission: EM | Admit: 2022-03-29 | Discharge: 2022-03-31 | DRG: 308 | Disposition: A | Discharge status: Home / Self Care | Payer: 59 | Attending: Internal Medicine | Admitting: Internal Medicine

## 2022-03-29 ENCOUNTER — Emergency Department (HOSPITAL_BASED_OUTPATIENT_CLINIC_OR_DEPARTMENT_OTHER): Payer: 59

## 2022-03-29 DIAGNOSIS — I5021 Acute systolic (congestive) heart failure: Secondary | ICD-10-CM | POA: Diagnosis present

## 2022-03-29 DIAGNOSIS — Z20822 Contact with and (suspected) exposure to covid-19: Secondary | ICD-10-CM | POA: Diagnosis not present

## 2022-03-29 DIAGNOSIS — R69 Illness, unspecified: Secondary | ICD-10-CM | POA: Diagnosis not present

## 2022-03-29 DIAGNOSIS — J9811 Atelectasis: Secondary | ICD-10-CM | POA: Diagnosis not present

## 2022-03-29 DIAGNOSIS — D6859 Other primary thrombophilia: Secondary | ICD-10-CM | POA: Diagnosis not present

## 2022-03-29 DIAGNOSIS — I493 Ventricular premature depolarization: Secondary | ICD-10-CM | POA: Diagnosis not present

## 2022-03-29 DIAGNOSIS — I499 Cardiac arrhythmia, unspecified: Secondary | ICD-10-CM | POA: Diagnosis not present

## 2022-03-29 DIAGNOSIS — J0100 Acute maxillary sinusitis, unspecified: Secondary | ICD-10-CM | POA: Diagnosis not present

## 2022-03-29 DIAGNOSIS — I4891 Unspecified atrial fibrillation: Secondary | ICD-10-CM | POA: Diagnosis not present

## 2022-03-29 DIAGNOSIS — I480 Paroxysmal atrial fibrillation: Principal | ICD-10-CM | POA: Diagnosis present

## 2022-03-29 DIAGNOSIS — R7401 Elevation of levels of liver transaminase levels: Secondary | ICD-10-CM | POA: Diagnosis present

## 2022-03-29 DIAGNOSIS — R0602 Shortness of breath: Secondary | ICD-10-CM | POA: Diagnosis not present

## 2022-03-29 DIAGNOSIS — Z8249 Family history of ischemic heart disease and other diseases of the circulatory system: Secondary | ICD-10-CM

## 2022-03-29 DIAGNOSIS — R7989 Other specified abnormal findings of blood chemistry: Secondary | ICD-10-CM | POA: Diagnosis present

## 2022-03-29 DIAGNOSIS — E7800 Pure hypercholesterolemia, unspecified: Secondary | ICD-10-CM | POA: Diagnosis not present

## 2022-03-29 DIAGNOSIS — Z885 Allergy status to narcotic agent status: Secondary | ICD-10-CM

## 2022-03-29 DIAGNOSIS — Z79899 Other long term (current) drug therapy: Secondary | ICD-10-CM

## 2022-03-29 DIAGNOSIS — I110 Hypertensive heart disease with heart failure: Secondary | ICD-10-CM | POA: Diagnosis present

## 2022-03-29 DIAGNOSIS — Z743 Need for continuous supervision: Secondary | ICD-10-CM | POA: Diagnosis not present

## 2022-03-29 DIAGNOSIS — R000 Tachycardia, unspecified: Secondary | ICD-10-CM | POA: Diagnosis not present

## 2022-03-29 DIAGNOSIS — J189 Pneumonia, unspecified organism: Secondary | ICD-10-CM | POA: Diagnosis not present

## 2022-03-29 DIAGNOSIS — I509 Heart failure, unspecified: Secondary | ICD-10-CM | POA: Diagnosis not present

## 2022-03-29 DIAGNOSIS — Z888 Allergy status to other drugs, medicaments and biological substances status: Secondary | ICD-10-CM

## 2022-03-29 DIAGNOSIS — I10 Essential (primary) hypertension: Secondary | ICD-10-CM | POA: Diagnosis not present

## 2022-03-29 DIAGNOSIS — Z880 Allergy status to penicillin: Secondary | ICD-10-CM

## 2022-03-29 DIAGNOSIS — I083 Combined rheumatic disorders of mitral, aortic and tricuspid valves: Secondary | ICD-10-CM | POA: Diagnosis not present

## 2022-03-29 DIAGNOSIS — I340 Nonrheumatic mitral (valve) insufficiency: Secondary | ICD-10-CM | POA: Diagnosis not present

## 2022-03-29 DIAGNOSIS — J811 Chronic pulmonary edema: Secondary | ICD-10-CM | POA: Diagnosis not present

## 2022-03-29 DIAGNOSIS — Z87891 Personal history of nicotine dependence: Secondary | ICD-10-CM

## 2022-03-29 LAB — CBC WITH DIFFERENTIAL/PLATELET
Abs Immature Granulocytes: 0.04 10*3/uL (ref 0.00–0.07)
Basophils Absolute: 0 10*3/uL (ref 0.0–0.1)
Basophils Relative: 0 %
Eosinophils Absolute: 0 10*3/uL (ref 0.0–0.5)
Eosinophils Relative: 1 %
HCT: 43.2 % (ref 39.0–52.0)
Hemoglobin: 14.1 g/dL (ref 13.0–17.0)
Immature Granulocytes: 1 %
Lymphocytes Relative: 14 %
Lymphs Abs: 1.2 10*3/uL (ref 0.7–4.0)
MCH: 27.9 pg (ref 26.0–34.0)
MCHC: 32.6 g/dL (ref 30.0–36.0)
MCV: 85.5 fL (ref 80.0–100.0)
Monocytes Absolute: 0.7 10*3/uL (ref 0.1–1.0)
Monocytes Relative: 9 %
Neutro Abs: 6.5 10*3/uL (ref 1.7–7.7)
Neutrophils Relative %: 75 %
Platelets: 278 10*3/uL (ref 150–400)
RBC: 5.05 MIL/uL (ref 4.22–5.81)
RDW: 14.4 % (ref 11.5–15.5)
WBC: 8.6 10*3/uL (ref 4.0–10.5)
nRBC: 0 % (ref 0.0–0.2)

## 2022-03-29 LAB — BASIC METABOLIC PANEL
Anion gap: 7 (ref 5–15)
BUN: 11 mg/dL (ref 6–20)
CO2: 23 mmol/L (ref 22–32)
Calcium: 9.1 mg/dL (ref 8.9–10.3)
Chloride: 108 mmol/L (ref 98–111)
Creatinine, Ser: 0.76 mg/dL (ref 0.61–1.24)
GFR, Estimated: >60 mL/min (ref >60)
Glucose, Bld: 125 mg/dL — ABNORMAL HIGH (ref 70–99)
Potassium: 4.1 mmol/L (ref 3.5–5.1)
Sodium: 138 mmol/L (ref 135–145)

## 2022-03-29 LAB — HEPATIC FUNCTION PANEL
ALT: 75 U/L — ABNORMAL HIGH (ref 0–44)
AST: 36 U/L (ref 15–41)
Albumin: 4 g/dL (ref 3.5–5.0)
Alkaline Phosphatase: 73 U/L (ref 38–126)
Bilirubin, Direct: 0.2 mg/dL (ref 0.0–0.2)
Indirect Bilirubin: 1 mg/dL — ABNORMAL HIGH (ref 0.3–0.9)
Total Bilirubin: 1.2 mg/dL (ref 0.3–1.2)
Total Protein: 7 g/dL (ref 6.5–8.1)

## 2022-03-29 LAB — TSH: TSH: 1.12 u[IU]/mL (ref 0.350–4.500)

## 2022-03-29 LAB — TROPONIN I (HIGH SENSITIVITY)
Troponin I (High Sensitivity): 6 ng/L (ref <18)
Troponin I (High Sensitivity): 6 ng/L (ref <18)

## 2022-03-29 LAB — D-DIMER, QUANTITATIVE: D-Dimer, Quant: 1.64 ug/mL-FEU — ABNORMAL HIGH (ref 0.00–0.50)

## 2022-03-29 LAB — MAGNESIUM: Magnesium: 1.8 mg/dL (ref 1.7–2.4)

## 2022-03-29 LAB — SARS CORONAVIRUS 2 BY RT PCR: SARS Coronavirus 2 by RT PCR: NEGATIVE

## 2022-03-29 LAB — BRAIN NATRIURETIC PEPTIDE: B Natriuretic Peptide: 439 pg/mL — ABNORMAL HIGH (ref 0.0–100.0)

## 2022-03-29 MED ORDER — IOHEXOL 350 MG/ML SOLN
80.0000 mL | Freq: Once | INTRAVENOUS | Status: AC | PRN
  Administered 2022-03-29: 80 mL via INTRAVENOUS

## 2022-03-29 MED ORDER — FUROSEMIDE 10 MG/ML IJ SOLN
40.0000 mg | Freq: Once | INTRAMUSCULAR | Status: AC
  Administered 2022-03-29: 40 mg via INTRAVENOUS
  Filled 2022-03-29: qty 4

## 2022-03-29 MED ORDER — FUROSEMIDE 20 MG PO TABS
20.0000 mg | ORAL_TABLET | Freq: Every day | ORAL | Status: DC
  Administered 2022-03-30: 20 mg via ORAL
  Filled 2022-03-29: qty 1

## 2022-03-29 MED ORDER — METOPROLOL SUCCINATE ER 25 MG PO TB24
25.0000 mg | ORAL_TABLET | Freq: Every day | ORAL | Status: DC
  Filled 2022-03-29: qty 1

## 2022-03-29 MED ORDER — SODIUM CHLORIDE 0.9 % IV BOLUS: 1000.0000 mL | Freq: Once | INTRAVENOUS | Status: DC

## 2022-03-29 MED ORDER — SODIUM CHLORIDE 0.9 % IV SOLN
1.0000 g | Freq: Once | INTRAVENOUS | Status: AC
  Administered 2022-03-29: 1 g via INTRAVENOUS
  Filled 2022-03-29: qty 10

## 2022-03-29 MED ORDER — METOPROLOL TARTRATE 12.5 MG HALF TABLET
12.5000 mg | ORAL_TABLET | Freq: Two times a day (BID) | ORAL | Status: AC
  Administered 2022-03-29 – 2022-03-30 (×3): 12.5 mg via ORAL
  Filled 2022-03-29 (×3): qty 1

## 2022-03-29 MED ORDER — DOXYCYCLINE HYCLATE 100 MG PO CAPS: 100.0000 mg | ORAL_CAPSULE | Freq: Two times a day (BID) | ORAL | 0 refills | Status: DC

## 2022-03-29 MED ORDER — AZITHROMYCIN 500 MG IV SOLR
500.0000 mg | Freq: Once | INTRAVENOUS | Status: AC
  Administered 2022-03-29: 500 mg via INTRAVENOUS
  Filled 2022-03-29: qty 5

## 2022-03-29 MED ORDER — APIXABAN 5 MG PO TABS
5.0000 mg | ORAL_TABLET | Freq: Two times a day (BID) | ORAL | Status: DC
  Administered 2022-03-29 – 2022-03-31 (×4): 5 mg via ORAL
  Filled 2022-03-29 (×4): qty 1

## 2022-03-29 MED ORDER — SODIUM CHLORIDE 0.9 % IV SOLN: INTRAVENOUS | Status: DC | PRN

## 2022-03-29 NOTE — ED Notes (Signed)
Patient is being discharged from the Urgent Care and sent to the Emergency Department via EMS . Per Legrand Como Ragan,NP patient is in need of higher level of care due to further evaluation. Patient is aware and verbalizes understanding of plan of care.  Vitals:   03/29/22 0857  BP: (!) 160/95  Pulse: (!) 114  Resp: 16  Temp: 98.3 F (36.8 C)  SpO2: 98%

## 2022-03-29 NOTE — Progress Notes (Addendum)
Pt arrived to unit.  Telemetry monitor placed and CCMD notified.  Call bell and phone within reach. Plan of care discussed with pt. Bed alarm in place. Pt requesting to eat.  Pt  placement paged.

## 2022-03-29 NOTE — ED Notes (Signed)
18g PIV placed in L AC per Kathi Ludwig FNP. IV placed by this RN. Attempts x1, pt tolerated well.

## 2022-03-29 NOTE — ED Triage Notes (Signed)
Pt presents with c/o SOB x 1 week. Pt states he has had congestion.

## 2022-03-29 NOTE — H&P (Addendum)
History and Physical  Aaron Crawford ZSW:[id number] DOB: 1961-03-17 DOA: 03/29/2022  Referring physician: Accepted by Dr. Lorin Mercy [facility], Hospitalist service.  PCP: Silverio Decamp, MD  Outpatient Specialists: None Patient coming from: Home through [facility] ED  Chief Complaint: Shortness of breath.  HPI: Aaron Crawford is a 61 y.o. male with medical history significant for untreated hypertension and hyperlipidemia, who initially presented to urgent care with complaints of progressive shortness of breath of 1 week duration.  Worse with exertion and with supine position.  No chest pain.  No palpitations.  He had an twelve-lead EKG done at urgent care which revealed new atrial fibrillation with rapid ventricular response.  EMS was activated.  The patient received IV diltiazem in route by EMS.  He was brought into [facility] ED for further evaluation and management of his new atrial fibrillation.    Chest x-ray done in the ED revealed pulmonary vascular congestion without overt pulmonary edema, subtle bibasilar atelectasis or infiltrate left greater than right.  Lab studies revealed elevated D-dimer 1.64 for which a CT angio chest revealed negative pulmonary embolism however it showed scattered reticular nodular opacities and right basilar atelectasis and small bilateral pleural effusion concerning for infectious/inflammatory process including pneumonia.  Follow-up examination to resolution is recommended.    The patient received empiric antibiotic treatment for community-acquired pneumonia, Rocephin and azithromycin.  Additionally received 1 dose of IV Lasix 40 mg x 1.  Twelve-lead EKG confirmed atrial fibrillation with rapid ventricular response.  BNP was elevated greater than 400.  Due to concern for new atrial fibrillation and new acute CHF, EDP requested admission.  The patient was accepted and admitted by Dr. Lorin Mercy, [facility] to [facility] telemetry cardiac unit.  ED Course: Tmax 98.8.  BP 124/98, pulse 109,  respiration rate 20, with saturation 96% on room air.  Lab studies significant for BNP 439.  CBC essentially unremarkable.  Review of Systems: Review of systems as noted in the HPI. All other systems reviewed and are negative.   Past Medical History:  Diagnosis Date   Hernia    Hyperlipidemia    History reviewed. No pertinent surgical history.  Social History:  reports that he quit smoking about 33 years ago. His smoking use included cigarettes. He has a 10.00 pack-year smoking history. He has never used smokeless tobacco. He reports that he does not currently use alcohol. He reports that he does not use drugs.   Allergies  Allergen Reactions   Ambien [Zolpidem] Other (See Comments)    Panic Attack   Penicillins Other (See Comments)    "i've always been told since I was a kid, that I am allergic, but I don't know what type of reaction."   Percocet [Oxycodone-Acetaminophen] Anxiety    Family History  Problem Relation Age of Onset   Alcohol abuse Mother    Cancer Mother    Cancer Father    Heart disease Father    Diabetes Sister    Colon cancer Neg Hx    Colon polyps Neg Hx    Esophageal cancer Neg Hx    Rectal cancer Neg Hx    Stomach cancer Neg Hx       Prior to Admission medications   Medication Sig Start Date End Date Taking? Authorizing Provider  GAMMA AMINOBUTYRIC ACID PO Take 750 mg by mouth daily.   Yes [provider]  ibuprofen (ADVIL) 200 MG tablet Take 200 mg by mouth every 6 (six) hours as needed for mild pain or headache.  Yes [provider]  doxycycline (VIBRAMYCIN) 100 MG capsule Take 1 capsule (100 mg total) by mouth 2 (two) times daily for 10 days. Patient not taking: Reported on 03/29/2022 03/29/22 04/08/22  Eliezer Lofts, FNP    Physical Exam: BP 126/90   Pulse (!) 106   Temp 98.5 F (36.9 C) (Oral)   Resp 20   Ht '5\' 11"'$  (1.803 m)   Wt 88.6 kg   SpO2 96%   BMI 27.24 kg/m   General: 61 y.o. year-old male well developed well  nourished in no acute distress.  Alert and oriented x3. Cardiovascular: Irregular rate and rhythm with no rubs or gallops.  No thyromegaly or JVD noted.  Trace lower extremity edema bilaterally. Respiratory: Mild rales at bases.  No wheezing noted.  Poor inspiratory effort. Abdomen: Soft nontender nondistended with normal bowel sounds x4 quadrants. Muskuloskeletal: No cyanosis or clubbing.  Trace lower extremity edema noted bilaterally Neuro: CN II-XII intact, strength, sensation, reflexes Skin: No ulcerative lesions noted or rashes Psychiatry: Judgement and insight appear normal. Mood is appropriate for condition and setting          Labs on Admission:  Basic Metabolic Panel: Recent Labs  Lab 03/29/22 1037  NA 138  K 4.1  CL 108  CO2 23  GLUCOSE 125*  BUN 11  CREATININE 0.76  CALCIUM 9.1  MG 1.8   Liver Function Tests: Recent Labs  Lab 03/29/22 1037  AST 36  ALT 75*  ALKPHOS 73  BILITOT 1.2  PROT 7.0  ALBUMIN 4.0   No results for input(s): "LIPASE", "AMYLASE" in the last 168 hours. No results for input(s): "AMMONIA" in the last 168 hours. CBC: Recent Labs  Lab 03/29/22 1037  WBC 8.6  NEUTROABS 6.5  HGB 14.1  HCT 43.2  MCV 85.5  PLT 278   Cardiac Enzymes: No results for input(s): "CKTOTAL", "CKMB", "CKMBINDEX", "TROPONINI" in the last 168 hours.  BNP (last 3 results) Recent Labs    03/29/22 1041  BNP 439.0*    ProBNP (last 3 results) No results for input(s): "PROBNP" in the last 8760 hours.  CBG: No results for input(s): "GLUCAP" in the last 168 hours.  Radiological Exams on Admission: CT Angio Chest PE W and/or Wo Contrast  Result Date: 03/29/2022 CLINICAL DATA:  Shortness of breath. Sent from urgent care. Atrial fibrillation. EXAM: CT ANGIOGRAPHY CHEST WITH CONTRAST TECHNIQUE: Multidetector CT imaging of the chest was performed using the standard protocol during bolus administration of intravenous contrast. Multiplanar CT image reconstructions and  MIPs were obtained to evaluate the vascular anatomy. RADIATION DOSE REDUCTION: This exam was performed according to the departmental dose-optimization program which includes automated exposure control, adjustment of the mA and/or kV according to patient size and/or use of iterative reconstruction technique. CONTRAST:  66m OMNIPAQUE IOHEXOL 350 MG/ML SOLN COMPARISON:  None Available. FINDINGS: Cardiovascular: Satisfactory opacification of the pulmonary arteries to the segmental level. No evidence of pulmonary embolism. Normal heart size. No pericardial effusion. Mediastinum/Nodes: Multiple subcentimeter vascular space and right paratracheal lymph nodes, likely reactive. Lungs/Pleura: Right basilar atelectasis or infiltrate. Small bilateral pleural effusions, right greater than the left. There are reticulonodular opacities suggesting infectious/inflammatory process. Calcified granuloma in the anterior aspect of the right lower lobe. Upper Abdomen: No acute abnormality. Musculoskeletal: No chest wall abnormality. No acute or significant osseous findings. Review of the MIP images confirms the above findings. IMPRESSION: 1.  No evidence of pulmonary embolism. 2. Scattered reticulonodular opacities and right basilar atelectasis and small bilateral  pleural effusions concerning for infectious/inflammatory process including pneumonia. Follow-up examination to resolution is recommended. Electronically Signed   By: Keane Police D.O.   On: 03/29/2022 13:00   DG Chest Portable 1 View  Result Date: 03/29/2022 CLINICAL DATA:  Shortness of breath. EXAM: PORTABLE CHEST 1 VIEW COMPARISON:  None Available. FINDINGS: 1039 hours. The cardio pericardial silhouette is enlarged. There is pulmonary vascular congestion without overt pulmonary edema. Subtle bibasilar atelectasis or infiltrate noted, left greater than right. Telemetry leads overlie the chest. IMPRESSION: 1. Pulmonary vascular congestion without overt pulmonary edema. 2.  Subtle bibasilar atelectasis or infiltrate, left greater than right. Electronically Signed   By: Misty Stanley M.D.   On: 03/29/2022 10:52    EKG: I independently viewed the EKG done and my findings are as followed: Atrial fibrillation rate of 99.  Nonspecific ST-T changes.  QTc 477.  Occasional PVCs.  Assessment/Plan Present on Admission:  New onset atrial fibrillation ([place])  Principal Problem:   New onset atrial fibrillation ([place])  New onset atrial fibrillation TSH within normal limits 1.120 CHA2DS2-VASc score 2 (hypertension and CHF) Started Eliquis and low-dose p.o. Lopressor 12.5 mg twice daily Follow 2D echo Consult cardiology in the morning  New onset acute CHF, unspecified Presented with dyspnea on exertion and orthopnea BNP elevated 439 Bilateral pleural effusions and mild pulmonary edema on chest x-ray and CT scan Follow 2D echo Start low-dose p.o. Lasix-received IV Lasix 40 mg x 1 in the ED on 03/29/2022. Start strict I's and O's and daily weight  Hyperlipidemia Last LDL 131 on 12/07/2020 Obtain fasting lipid panel in the morning Not on antilipid medication at home Treat as indicated  Essential hypertension His blood pressure has been persistently elevated P.o. Lopressor 12.5 mg twice daily Monitor vital signs.  Elevated liver chemistries ALT 75, Endya bilirubin 1.0. Monitor for now Repeat chemistry panel in the morning.  Elevated D-dimer CTA chest negative for PE.  Reticular nodular opacities Seen on CT scan, also seen right basilar atelectasis and small bilateral pleural effusion concerning for infectious/inflammatory process including pneumonia No clear evidence of active infective process Nonseptic appearing Afebrile with no leukocytosis Hold off antibiotics for now Has received 1 dose of Rocephin and azithromycin in the ED Obtain procalcitonin level in the morning Monitor fever curve and WBC  Small bilateral pleural effusions, suspect  cardiogenic Not hypoxic, O2 saturation 96% on room air. P.o. Lasix 20 mg daily Incentive spirometer Mobilize as tolerated Maintain saturation above 92%   DVT prophylaxis: Eliquis  Code Status: Full code  Family Communication: Wife at bedside.  Disposition Plan: Admitted to telemetry cardiac unit  Consults called: Consult cardiology in the morning  Admission status: Inpatient status.   Status is: Inpatient The patient requires at least 2 midnights for further evaluation and treatment of present condition.   Kayleen Memos MD [facility] Pager [phone]  If 7PM-7AM, please contact night-coverage [email or web address] Password [email or web address]  03/29/2022, 9:09 PM

## 2022-03-29 NOTE — Discharge Instructions (Addendum)
Patient transported via [facility] to [facility]  Patient has called his wife from exam room who will meet him at this facility.

## 2022-03-29 NOTE — ED Triage Notes (Signed)
Pt reports SHOB x 1 wk; was seen at [facility] and found to be in afib w/ RVR; referred here

## 2022-03-29 NOTE — Progress Notes (Signed)
ANTICOAGULATION CONSULT NOTE - Initial Consult  Pharmacy Consult for Apixaban Indication: atrial fibrillation, new onset  Allergies  Allergen Reactions   Ambien [Zolpidem] Other (See Comments)    Panic Attack   Penicillins Other (See Comments)    "i've always been told since I was a kid, that I am allergic, but I don't know what type of reaction."   Percocet [Oxycodone-Acetaminophen] Anxiety    Patient Measurements: Height: '5\' 11"'$  (180.3 cm) Weight: 88.6 kg (195 lb 5.2 oz) IBW/kg (Calculated) : 75.3 kg   Vital Signs: Temp: 98.5 F (36.9 C) (07/05 1824) Temp Source: Oral (07/05 1824) BP: 126/90 (07/05 1730) Pulse Rate: 106 (07/05 1824)  Labs: Recent Labs    03/29/22 1037 03/29/22 1240  HGB 14.1  --   HCT 43.2  --   PLT 278  --   CREATININE 0.76  --   TROPONINIHS 6 6    Estimated Creatinine Clearance: 107.2 mL/min (by C-G formula based on SCr of 0.76 mg/dL).   Medical History: Past Medical History:  Diagnosis Date   Hernia    Hyperlipidemia     Medications:  Medications Prior to Admission  Medication Sig Dispense Refill Last Dose   GAMMA AMINOBUTYRIC ACID PO Take 750 mg by mouth daily.   Past Week   ibuprofen (ADVIL) 200 MG tablet Take 200 mg by mouth every 6 (six) hours as needed for mild pain or headache.   Past Month   doxycycline (VIBRAMYCIN) 100 MG capsule Take 1 capsule (100 mg total) by mouth 2 (two) times daily for 10 days. (Patient not taking: Reported on 03/29/2022) 20 capsule 0 Not Taking    Assessment:  61 y.o male  transferred from [facility] to [facility] for new onset atrial fibrillation.  Pharmacy consulted to start apixaban.  The patient was not on anticoagulation prior to admission.   Plan:  Start Apixaban '5mg'$  po BID.  Monitor for bleeding.  We will educate patient about apixaban prior to discharge.  Educational information added to AVS.    Nicole Cella, RPh Clinical Pharmacist 03/29/2022,9:36 PM Please check AMION for all [place]  phone numbers After 10:00 PM, call [place] [phone]

## 2022-03-29 NOTE — ED Provider Notes (Signed)
[facility] EMERGENCY DEPARTMENT Provider Note   CSN: [id number] Arrival date & time: 03/29/22  1019     History  Chief Complaint  Patient presents with   Shortness of Breath    Aaron Crawford is a 61 y.o. male.  Patient is here with shortness of breath.  Sent from urgent care for new A-fib.  He got IV diltiazem in route with EMS.  Patient had shortness of breath for the past week somewhat worse when he lies flat and sometimes worse with exertion.  History of high cholesterol.  Former smoker.  Does admit to alcohol use.  No other drug use.  Nothing makes it worse or better.  He has had a dry cough.  No history of A-fib.  Does not notice palpitations.  Nothing makes it better except for rest sometimes.  Denies any chest pain, abdominal pain, weakness, numbness.  Denies any leg swelling.  No recent surgery or travel or clot history.  The history is provided by the patient.       Home Medications Prior to Admission medications   Medication Sig Start Date End Date Taking? Authorizing Provider  ascorbic acid (VITAMIN C) 250 MG tablet Take by mouth. Patient not taking: Reported on 03/29/2022    [provider]  Cholecalciferol 25 MCG (1000 UT) tablet Take by mouth. Patient not taking: Reported on 03/29/2022    [provider]  cyanocobalamin 1000 MCG tablet Take 1,000 mcg by mouth daily. Patient not taking: Reported on 03/29/2022    [provider]  doxycycline (VIBRAMYCIN) 100 MG capsule Take 1 capsule (100 mg total) by mouth 2 (two) times daily for 10 days. 03/29/22 04/08/22  Eliezer Lofts, FNP  zinc gluconate 50 MG tablet Take 50 mg by mouth daily. Patient not taking: Reported on 03/29/2022    [provider]      Allergies    Ambien [zolpidem], Penicillins, and Percocet [oxycodone-acetaminophen]    Review of Systems   Review of Systems  Physical Exam Updated Vital Signs BP (!) 142/102   Pulse (!) 112   Temp 98.8 F (37.1 C) (Oral)   Resp  (!) 26   Ht '5\' 11"'$  (1.803 m)   Wt 89.8 kg   SpO2 97%   BMI 27.62 kg/m  Physical Exam Vitals and nursing note reviewed.  Constitutional:      General: He is not in acute distress.    Appearance: He is well-developed. He is not ill-appearing.  HENT:     Head: Normocephalic and atraumatic.     Mouth/Throat:     Mouth: Mucous membranes are moist.  Eyes:     Conjunctiva/sclera: Conjunctivae normal.     Pupils: Pupils are equal, round, and reactive to light.  Cardiovascular:     Rate and Rhythm: Tachycardia present. Rhythm irregular.     Pulses: Normal pulses.     Heart sounds: Normal heart sounds. No murmur heard. Pulmonary:     Effort: Pulmonary effort is normal. No respiratory distress.     Breath sounds: Decreased breath sounds present.  Abdominal:     Palpations: Abdomen is soft.     Tenderness: There is no abdominal tenderness.  Musculoskeletal:        General: No swelling.     Cervical back: Normal range of motion and neck supple.     Right lower leg: Edema present.     Left lower leg: Edema present.     Comments: Trace edema in his legs bilaterally  Skin:    General: Skin is warm and dry.     Capillary Refill: Capillary refill takes less than 2 seconds.  Neurological:     General: No focal deficit present.     Mental Status: He is alert.  Psychiatric:        Mood and Affect: Mood normal.     ED Results / Procedures / Treatments   Labs (all labs ordered are listed, but only abnormal results are displayed) Labs Reviewed  BASIC METABOLIC PANEL - Abnormal; Notable for the following components:      Result Value   Glucose, Bld 125 (*)    All other components within normal limits  D-DIMER, QUANTITATIVE - Abnormal; Notable for the following components:   D-Dimer, Quant 1.64 (*)    All other components within normal limits  BRAIN NATRIURETIC PEPTIDE - Abnormal; Notable for the following components:   B Natriuretic Peptide 439.0 (*)    All other components within  normal limits  HEPATIC FUNCTION PANEL - Abnormal; Notable for the following components:   ALT 75 (*)    Indirect Bilirubin 1.0 (*)    All other components within normal limits  CBC WITH DIFFERENTIAL/PLATELET  MAGNESIUM  TSH  TROPONIN I (HIGH SENSITIVITY)  TROPONIN I (HIGH SENSITIVITY)    EKG EKG Interpretation  Date/Time:  Wednesday March 29 2022 10:30:07 EDT Ventricular Rate:  99 PR Interval:    QRS Duration: 87 QT Interval:  371 QTC Calculation: 477 R Axis:   -58 Text Interpretation: Atrial fibrillation Ventricular premature complex Left anterior fascicular block P Confirmed by Lennice Sites ([id number]) on 03/29/2022 10:35:39 AM  Radiology CT Angio Chest PE W and/or Wo Contrast  Result Date: 03/29/2022 CLINICAL DATA:  Shortness of breath. Sent from urgent care. Atrial fibrillation. EXAM: CT ANGIOGRAPHY CHEST WITH CONTRAST TECHNIQUE: Multidetector CT imaging of the chest was performed using the standard protocol during bolus administration of intravenous contrast. Multiplanar CT image reconstructions and MIPs were obtained to evaluate the vascular anatomy. RADIATION DOSE REDUCTION: This exam was performed according to the departmental dose-optimization program which includes automated exposure control, adjustment of the mA and/or kV according to patient size and/or use of iterative reconstruction technique. CONTRAST:  68m OMNIPAQUE IOHEXOL 350 MG/ML SOLN COMPARISON:  None Available. FINDINGS: Cardiovascular: Satisfactory opacification of the pulmonary arteries to the segmental level. No evidence of pulmonary embolism. Normal heart size. No pericardial effusion. Mediastinum/Nodes: Multiple subcentimeter vascular space and right paratracheal lymph nodes, likely reactive. Lungs/Pleura: Right basilar atelectasis or infiltrate. Small bilateral pleural effusions, right greater than the left. There are reticulonodular opacities suggesting infectious/inflammatory process. Calcified granuloma in the  anterior aspect of the right lower lobe. Upper Abdomen: No acute abnormality. Musculoskeletal: No chest wall abnormality. No acute or significant osseous findings. Review of the MIP images confirms the above findings. IMPRESSION: 1.  No evidence of pulmonary embolism. 2. Scattered reticulonodular opacities and right basilar atelectasis and small bilateral pleural effusions concerning for infectious/inflammatory process including pneumonia. Follow-up examination to resolution is recommended. Electronically Signed   By: IKeane PoliceD.O.   On: 03/29/2022 13:00   DG Chest Portable 1 View  Result Date: 03/29/2022 CLINICAL DATA:  Shortness of breath. EXAM: PORTABLE CHEST 1 VIEW COMPARISON:  None Available. FINDINGS: 1039 hours. The cardio pericardial silhouette is enlarged. There is pulmonary vascular congestion without overt pulmonary edema. Subtle bibasilar atelectasis or infiltrate noted, left greater than right. Telemetry leads overlie the chest. IMPRESSION: 1. Pulmonary vascular congestion without overt pulmonary edema. 2. Subtle  bibasilar atelectasis or infiltrate, left greater than right. Electronically Signed   By: Misty Stanley M.D.   On: 03/29/2022 10:52    Procedures Procedures    Medications Ordered in ED Medications  cefTRIAXone (ROCEPHIN) 1 g in sodium chloride 0.9 % 100 mL IVPB (has no administration in time range)  azithromycin (ZITHROMAX) 500 mg in sodium chloride 0.9 % 250 mL IVPB (has no administration in time range)  furosemide (LASIX) injection 40 mg (has no administration in time range)  iohexol (OMNIPAQUE) 350 MG/ML injection 80 mL (80 mLs Intravenous Contrast Given 03/29/22 1133)    ED Course/ Medical Decision Making/ A&P                           Medical Decision Making Amount and/or Complexity of Data Reviewed Labs: ordered. Radiology: ordered.  Risk Prescription drug management. Decision regarding hospitalization.   Moses Odoherty is here with shortness of breath, new  atrial fibrillation.  History of high cholesterol.  Patient arrives with overall unremarkable vitals.  Heart rate mostly in the 90s.  EKG per my review interpretation does show atrial fibrillation.  He got IV diltiazem in route with EMS from urgent care.  He has been having progressive shortness of breath for the past week.  Some difficulty when he walks and when he lies flat.  I noticed some leg swelling on exam.  He admits alcohol use but otherwise no major changes recently.  No recent illness.  No numbness or stroke symptoms.  No chest pain.  Somewhat diminished lung exam bilaterally.  No obvious rales/rhonchi.  Differential diagnosis includes electrolyte abnormality, volume overload, less likely ACS, PE.  Overall he has new atrial fibrillation not sure if this is in the setting of alcohol use or heart failure.  At this time we will get CBC, BMP, hepatic function panel, troponin, D-dimer, chest x-ray.  We will hold off on any other rate control or intervention at this time as heart rate is in the 80s.  He is normotensive.  Mali Vasc score is 0.  Per my review and interpretation of labs, BNP is 439, troponin is normal.  No leukocytosis or anemia or electrolyte abnormality.  D-dimer is elevated.  CT scan showed no evidence of PE but did show some scattered reticular nodule opacities and right basilar atelectasis and small bilateral pleural effusions.  Suspicious for may be infectious or inflammatory process including pneumonia.  However suspect may be volume overload and heart failure.  Will admit for further work-up.  We will start IV antibiotics and give a dose IV Lasix.  Believe he benefit from echocardiogram.  At this time his heart rate is mostly in the 90s.  He is rate controlled and will not further treat atrial fibrillation at this time.  This chart was dictated using voice recognition software.  Despite best efforts to proofread,  errors can occur which can change the documentation meaning.          Final Clinical Impression(s) / ED Diagnoses Final diagnoses:  Atrial fibrillation with rapid ventricular response (HCC)  Shortness of breath    Rx / DC Orders ED Discharge Orders          Ordered    Amb referral to AFIB Clinic        03/29/22 [employer], [place], DO 03/29/22 1307

## 2022-03-29 NOTE — ED Provider Notes (Signed)
[name]    CSN: [id number] Arrival date & time: 03/29/22  0851      History   Chief Complaint Chief Complaint  Patient presents with   Shortness of Breath    HPI Aaron Crawford is a 61 y.o. male.   HPI Very pleasant 61 year old male presents with shortness of breath for 1 week. Additionally, patient reports sinus nasal congestion.  Patient [place] he felt heaviness and pressure to chest.  PMH significant for HLD, fatigue, and bilateral inguinal hernia  Past Medical History:  Diagnosis Date   Hernia    Hyperlipidemia     Patient Active Problem List   Diagnosis Date Noted   Annual physical exam 12/01/2020   Fatigue 10/07/2019   Alcohol consumption heavy 10/02/2017   Bilateral hearing loss due to cerumen impaction 02/02/2017   Skin lesion of right arm 04/19/2016   Left knee pain 04/17/2016   Bilateral inguinal hernia 09/10/2012   Hyperlipemia 04/27/2011    History reviewed. No pertinent surgical history.     Home Medications    Prior to Admission medications   Medication Sig Start Date End Date Taking? Authorizing Provider  doxycycline (VIBRAMYCIN) 100 MG capsule Take 1 capsule (100 mg total) by mouth 2 (two) times daily for 10 days. 03/29/22 04/08/22 Yes Eliezer Lofts, FNP  ascorbic acid (VITAMIN C) 250 MG tablet Take by mouth. Patient not taking: Reported on 03/29/2022    [provider]  Cholecalciferol 25 MCG (1000 UT) tablet Take by mouth. Patient not taking: Reported on 03/29/2022    [provider]  cyanocobalamin 1000 MCG tablet Take 1,000 mcg by mouth daily. Patient not taking: Reported on 03/29/2022    [provider]  zinc gluconate 50 MG tablet Take 50 mg by mouth daily. Patient not taking: Reported on 03/29/2022    [provider]    Family History Family History  Problem Relation Age of Onset   Alcohol abuse Mother    Cancer Mother    Cancer Father     Heart disease Father    Diabetes Sister    Colon cancer Neg Hx    Colon polyps Neg Hx    Esophageal cancer Neg Hx    Rectal cancer Neg Hx    Stomach cancer Neg Hx     Social History Social History   Tobacco Use   Smoking status: Former    Packs/day: 1.00    Years: 10.00    Total pack years: 10.00    Types: Cigarettes    Quit date: 02/22/1989    Years since quitting: 33.1   Smokeless tobacco: Never  Vaping Use   Vaping Use: Never used  Substance Use Topics   Alcohol use: Not Currently   Drug use: No     Allergies   Ambien [zolpidem], Penicillins, and Percocet [oxycodone-acetaminophen]   Review of Systems Review of Systems  Respiratory:  Positive for shortness of breath.   All other systems reviewed and are negative.    Physical Exam Triage Vital Signs ED Triage Vitals [03/29/22 0857]  Enc Vitals Group     BP (!) 160/95     Pulse Rate (!) 114     Resp 16     Temp 98.3 F (36.8 C)     Temp Source Oral     SpO2 98 %     Weight      Height      Head Circumference  Peak Flow      Pain Score 0     Pain Loc      Pain Edu?      Excl. in [place]?    No data found.  Updated Vital Signs BP (!) 160/95 (BP Location: Right Arm)   Pulse (!) 114   Temp 98.3 F (36.8 C) (Oral)   Resp 16   SpO2 98%    Physical Exam Vitals and nursing note reviewed.  Constitutional:      Appearance: Normal appearance. He is normal weight.  HENT:     Head: Normocephalic and atraumatic.     Right Ear: Tympanic membrane and external ear normal.     Left Ear: Tympanic membrane and external ear normal.     Ears:     Comments: Moderate eustachian tube dysfunction noted bilaterally    Mouth/Throat:     Mouth: Mucous membranes are moist.     Pharynx: Oropharynx is clear.  Eyes:     Extraocular Movements: Extraocular movements intact.     Conjunctiva/sclera: Conjunctivae normal.     Pupils: Pupils are equal, round, and reactive to light.  Cardiovascular:     Pulses: Normal  pulses.     Comments: Hypertensive, irregularly irregular Pulmonary:     Effort: Pulmonary effort is normal.     Breath sounds: Normal breath sounds. No wheezing, rhonchi or rales.  Musculoskeletal:     Cervical back: Normal range of motion and neck supple.  Skin:    General: Skin is warm and dry.  Neurological:     General: No focal deficit present.     Mental Status: He is alert and oriented to person, place, and time.      UC Treatments / Results  Labs (all labs ordered are listed, but only abnormal results are displayed) Labs Reviewed - No data to display  EKG   Radiology No results found.  Procedures Procedures (including critical care time)  Medications Ordered in UC Medications - No data to display  Initial Impression / Assessment and Plan / UC Course  I have reviewed the triage vital signs and the nursing notes.  Pertinent labs & imaging results that were available during my care of the patient were reviewed by me and considered in my medical decision making (see chart for details).     MDM: 1.  Atrial fibrillation with rapid ventricular response-EMS called at 9:32 AM EKG revealed A-fib with RVR, left axis deviation, pulmonary disease pattern, possible inferior infarct, abnormal EKG; [facility] departed our facility and 9:50 AM this morning 2.  Acute maxillary sinusitis-Rx'd Doxycycline. Final Clinical Impressions(s) / UC Diagnoses   Final diagnoses:  Atrial fibrillation with rapid ventricular response ([place])  Subacute maxillary sinusitis     Discharge Instructions      Patient transported via [facility] to [facility].  Patient has called his wife from exam room who will meet him at this facility.     ED Prescriptions     Medication Sig Dispense Auth. Provider   doxycycline (VIBRAMYCIN) 100 MG capsule Take 1 capsule (100 mg total) by mouth 2 (two) times daily for 10 days. 20 capsule Eliezer Lofts, FNP      PDMP  not reviewed this encounter.   Eliezer Lofts, [place] 03/29/22 1007

## 2022-03-29 NOTE — Discharge Instructions (Signed)

## 2022-03-29 NOTE — Progress Notes (Signed)
Plan of Care Note for accepted transfer   Patient: Aaron Crawford MRN: [id number]   [place]: 03/29/2022  Facility requesting transfer: [facility] Requesting Provider: Vernell Barrier Reason for transfer: New-onset afib Facility course: Patient with HLD presenting with nasal congestion and SOB.  He had some exertional CP yesterday and went to UC today but was sent to the ER when they recognized new-onset afib.  Concern for CHF.  1 week of SOB.  HR 120s.  EMS gave 20 mg Dilt with improved HR, stable BP.  Has LE edema.  +ETOH.  No CP, can't lie flat, +DOE.  BMP 440.  Troponin  negative.  D-dimer elevated, CTA with scattered opacities - infection vs. Asymmetric edema.  Given Lasix, antibiotics - but low suspicion for active infection.  Appears more cardiac than infectious.  COVID test pending.   Plan of care: The patient is accepted for admission to Telemetry unit, at [facility].   Author: Karmen Bongo, MD 03/29/2022  Check [email or web address] for on-call coverage.  Nursing staff, Please call [place] number on Amion as soon as patient's arrival, so appropriate admitting provider can evaluate the pt.

## 2022-03-30 ENCOUNTER — Inpatient Hospital Stay (HOSPITAL_COMMUNITY): Payer: 59

## 2022-03-30 DIAGNOSIS — I4891 Unspecified atrial fibrillation: Secondary | ICD-10-CM

## 2022-03-30 DIAGNOSIS — R0602 Shortness of breath: Secondary | ICD-10-CM

## 2022-03-30 LAB — CBC WITH DIFFERENTIAL/PLATELET
Abs Immature Granulocytes: 0.03 10*3/uL (ref 0.00–0.07)
Basophils Absolute: 0 10*3/uL (ref 0.0–0.1)
Basophils Relative: 0 %
Eosinophils Absolute: 0.1 10*3/uL (ref 0.0–0.5)
Eosinophils Relative: 1 %
HCT: 42.7 % (ref 39.0–52.0)
Hemoglobin: 13.7 g/dL (ref 13.0–17.0)
Immature Granulocytes: 0 %
Lymphocytes Relative: 22 %
Lymphs Abs: 1.8 10*3/uL (ref 0.7–4.0)
MCH: 27.8 pg (ref 26.0–34.0)
MCHC: 32.1 g/dL (ref 30.0–36.0)
MCV: 86.6 fL (ref 80.0–100.0)
Monocytes Absolute: 0.8 10*3/uL (ref 0.1–1.0)
Monocytes Relative: 10 %
Neutro Abs: 5.3 10*3/uL (ref 1.7–7.7)
Neutrophils Relative %: 67 %
Platelets: 248 10*3/uL (ref 150–400)
RBC: 4.93 MIL/uL (ref 4.22–5.81)
RDW: 14.3 % (ref 11.5–15.5)
WBC: 8 10*3/uL (ref 4.0–10.5)
nRBC: 0 % (ref 0.0–0.2)

## 2022-03-30 LAB — MAGNESIUM: Magnesium: 2.1 mg/dL (ref 1.7–2.4)

## 2022-03-30 LAB — ECHOCARDIOGRAM COMPLETE
AR max vel: 2.64 cm2
AV Peak grad: 2.6 mmHg
Ao pk vel: 0.8 m/s
Area-P 1/2: 6.48 cm2
Calc EF: 41.8 %
Height: 71 in
MV M vel: 4.27 m/s
MV Peak grad: 72.9 mmHg
S' Lateral: 4.1 cm
Single Plane A2C EF: 41.6 %
Single Plane A4C EF: 42.1 %
Weight: 3094.4 oz

## 2022-03-30 LAB — LIPID PANEL
Cholesterol: 157 mg/dL (ref 0–200)
HDL: 51 mg/dL (ref >40)
LDL Cholesterol: 91 mg/dL (ref 0–99)
Total CHOL/HDL Ratio: 3.1 RATIO
Triglycerides: 75 mg/dL (ref <150)
VLDL: 15 mg/dL (ref 0–40)

## 2022-03-30 LAB — COMPREHENSIVE METABOLIC PANEL
ALT: 62 U/L — ABNORMAL HIGH (ref 0–44)
AST: 26 U/L (ref 15–41)
Albumin: 3.7 g/dL (ref 3.5–5.0)
Alkaline Phosphatase: 65 U/L (ref 38–126)
Anion gap: 9 (ref 5–15)
BUN: 11 mg/dL (ref 6–20)
CO2: 27 mmol/L (ref 22–32)
Calcium: 9.5 mg/dL (ref 8.9–10.3)
Chloride: 106 mmol/L (ref 98–111)
Creatinine, Ser: 1.01 mg/dL (ref 0.61–1.24)
GFR, Estimated: >60 mL/min (ref >60)
Glucose, Bld: 102 mg/dL — ABNORMAL HIGH (ref 70–99)
Potassium: 4.4 mmol/L (ref 3.5–5.1)
Sodium: 142 mmol/L (ref 135–145)
Total Bilirubin: 1 mg/dL (ref 0.3–1.2)
Total Protein: 6.3 g/dL — ABNORMAL LOW (ref 6.5–8.1)

## 2022-03-30 LAB — HIV ANTIBODY (ROUTINE TESTING W REFLEX): HIV Screen 4th Generation wRfx: NONREACTIVE

## 2022-03-30 LAB — PHOSPHORUS: Phosphorus: 4.1 mg/dL (ref 2.5–4.6)

## 2022-03-30 MED ORDER — FUROSEMIDE 40 MG PO TABS
40.0000 mg | ORAL_TABLET | Freq: Every day | ORAL | Status: DC
  Administered 2022-03-31: 40 mg via ORAL
  Filled 2022-03-30: qty 1

## 2022-03-30 MED ORDER — METOPROLOL SUCCINATE ER 50 MG PO TB24
50.0000 mg | ORAL_TABLET | Freq: Every day | ORAL | Status: DC
  Administered 2022-03-31: 50 mg via ORAL
  Filled 2022-03-30: qty 1

## 2022-03-30 MED ORDER — DIPHENHYDRAMINE HCL 25 MG PO CAPS
25.0000 mg | ORAL_CAPSULE | Freq: Every evening | ORAL | Status: DC | PRN
  Administered 2022-03-30: 50 mg via ORAL
  Filled 2022-03-30: qty 2

## 2022-03-30 MED ORDER — SPIRONOLACTONE 12.5 MG HALF TABLET
12.5000 mg | ORAL_TABLET | Freq: Every day | ORAL | Status: DC
Start: 2022-03-30 — End: 2022-03-31
  Administered 2022-03-30 – 2022-03-31 (×2): 12.5 mg via ORAL
  Filled 2022-03-30 (×2): qty 1

## 2022-03-30 NOTE — Progress Notes (Signed)
[facility] PROGRESS NOTE   Aaron Crawford WUJ:[id number] DOB: [birth year] DOA: 03/29/2022  PCP: Silverio Decamp, MD  Brief History/Interval Summary: 61 y.o. male with medical history significant for untreated hypertension and hyperlipidemia, who initially presented to urgent care with complaints of progressive shortness of breath of 1 week duration.  Worse with exertion and with supine position.  Found to have atrial fibrillation with RVR.  Patient was transported to the emergency department.  Evaluation also raise concern for possible CHF.  Patient was hospitalized for further management.    Consultants: Cardiology  Procedures: Transthoracic echocardiogram is pending    Subjective/Interval History: Patient denies any chest pain.  Shortness of breath seems to be improving.  No dizziness or lightheadedness.  He does drink on average about 10-14 beers on a weekly basis.  Denies any history of smoking.    Assessment/Plan:  Atrial fibrillation, new onset, with RVR TSH was noted to be normal.  Patient was started on metoprolol and Eliquis.  Echocardiogram is pending.  Cardiology has been consulted. Heart rate seems to be poorly controlled.  We will see what cardiology recommends before altering treatment.  Dyspnea/possible CHF, unspecified Possibly due to atrial fibrillation along with concomitant CHF.  CT angiogram did not show any PE.  BNP was noted to be elevated.  Troponin level is normal.  Patient given furosemide with some improvement in symptoms.  Echocardiogram is pending. CT angiogram did raise concern for atelectasis, bilateral pleural effusions and reticulonodular opacities.  Low suspicion for infectious etiology.  WBC is normal.  Continue to monitor off of antibiotics.  History of hyperlipidemia Does not take any medications for same at home.  LDL is noted to be 91 with total cholesterol 157, HDL 51, triglycerides 75.  Elevated blood pressure Noted to have  elevated blood pressure at the time of admission.  Supposedly has a history of essential hypertension but does not take any medications for same.  Currently on metoprolol.  Abnormal LFTs Mildly elevated ALT noted.  Probably related to alcohol consumption.  Check hepatitis panel.   DVT Prophylaxis: On Eliquis Code Status: Full code Family Communication: Discussed with patient and his wife Disposition Plan: Hopefully return home when improved  Status is: Inpatient Remains inpatient appropriate because: New onset atrial fibrillation with RVR and possible CHF      Medications: Scheduled:  apixaban  5 mg Oral BID   furosemide  20 mg Oral Daily   metoprolol tartrate  12.5 mg Oral BID   Continuous:  sodium chloride Stopped (03/29/22 1401)   NFA:OZHYQM chloride  Antibiotics: Anti-infectives (From admission, onward)    Start     Dose/Rate Route Frequency Ordered Stop   03/29/22 1315  cefTRIAXone (ROCEPHIN) 1 g in sodium chloride 0.9 % 100 mL IVPB        1 g 200 mL/hr over 30 Minutes Intravenous  Once 03/29/22 1305 03/29/22 1357   03/29/22 1315  azithromycin (ZITHROMAX) 500 mg in sodium chloride 0.9 % 250 mL IVPB        500 mg 250 mL/hr over 60 Minutes Intravenous  Once 03/29/22 1305 03/29/22 1517       Objective:  Vital Signs  Vitals:   03/30/22 0200 03/30/22 0400 03/30/22 0537 03/30/22 0712  BP: 101/75 97/61  (!) 128/101  Pulse:    91  Resp:  15  18  Temp:  98.1 F (36.7 C)  98.1 F (36.7 C)  TempSrc:  Oral  Oral  SpO2:  97%  97%  Weight:   87.7 kg   Height:        Intake/Output Summary (Last 24 hours) at 03/30/2022 1015 Last data filed at 03/30/2022 0850 Gross per 24 hour  Intake 867.45 ml  Output 3300 ml  Net -2432.55 ml   Filed Weights   03/29/22 1033 03/29/22 1824 03/30/22 0537  Weight: 89.8 kg 88.6 kg 87.7 kg    General appearance: Awake alert.  In no distress Resp: Normal effort at rest.  Few crackles bilateral bases.  No wheezing or  rhonchi. Cardio: S1-S2 is irregularly irregular GI: Abdomen is soft.  Nontender nondistended.  Bowel sounds are present normal.  No masses organomegaly Extremities: No edema.  Full range of motion of lower extremities. Neurologic: Alert and oriented x3.  No focal neurological deficits.    Lab Results:  Data Reviewed: I have personally reviewed following labs and reports of the imaging studies  CBC: Recent Labs  Lab 03/29/22 1037 03/30/22 0307  WBC 8.6 8.0  NEUTROABS 6.5 5.3  HGB 14.1 13.7  HCT 43.2 42.7  MCV 85.5 86.6  PLT 278 270    Basic Metabolic Panel: Recent Labs  Lab 03/29/22 1037 03/30/22 0307  NA 138 142  K 4.1 4.4  CL 108 106  CO2 23 27  GLUCOSE 125* 102*  BUN 11 11  CREATININE 0.76 1.01  CALCIUM 9.1 9.5  MG 1.8 2.1  PHOS  --  4.1    GFR: Estimated Creatinine Clearance: 84.9 mL/min (by C-G formula based on SCr of 1.01 mg/dL).  Liver Function Tests: Recent Labs  Lab 03/29/22 1037 03/30/22 0307  AST 36 26  ALT 75* 62*  ALKPHOS 73 65  BILITOT 1.2 1.0  PROT 7.0 6.3*  ALBUMIN 4.0 3.7    Lipid Profile: Recent Labs    03/30/22 0307  CHOL 157  HDL 51  LDLCALC 91  TRIG 75  CHOLHDL 3.1    Thyroid Function Tests: Recent Labs    03/29/22 1037  TSH 1.120     Recent Results (from the past 240 hour(s))  SARS Coronavirus 2 by RT PCR (hospital order, performed in [facility] lab) *cepheid single result test* Anterior Nasal Swab     Status: None   Collection Time: 03/29/22  1:55 PM   Specimen: Anterior Nasal Swab  Result Value Ref Range Status   SARS Coronavirus 2 by RT PCR NEGATIVE NEGATIVE Final    Comment: (NOTE) SARS-CoV-2 target nucleic acids are NOT DETECTED.  The SARS-CoV-2 RNA is generally detectable in upper and lower respiratory specimens during the acute phase of infection. The lowest concentration of SARS-CoV-2 viral copies this assay can detect is 250 copies / mL. A negative result does not preclude SARS-CoV-2  infection and should not be used as the sole basis for treatment or other patient management decisions.  A negative result may occur with improper specimen collection / handling, submission of specimen other than nasopharyngeal swab, presence of viral mutation(s) within the areas targeted by this assay, and inadequate number of viral copies (<250 copies / mL). A negative result must be combined with clinical observations, patient history, and epidemiological information.  Fact Sheet for Patients:   https://www.patel.info/  Fact Sheet for Healthcare Providers: https://hall.com/  This test is not yet approved or  cleared by the [place] FDA and has been authorized for detection and/or diagnosis of SARS-CoV-2 by FDA under an Emergency Use Authorization (EUA).  This EUA will remain in effect (meaning this test can be used) for  the duration of the COVID-19 declaration under Section 564(b)(1) of the Act, 21 U.S.C. section 360bbb-3(b)(1), unless the authorization is terminated or revoked sooner.  Performed at [facility], [address] [place], [place] [address]       Radiology Studies: CT Angio Chest PE W and/or Wo Contrast  Result Date: 03/29/2022 CLINICAL DATA:  Shortness of breath. Sent from urgent care. Atrial fibrillation. EXAM: CT ANGIOGRAPHY CHEST WITH CONTRAST TECHNIQUE: Multidetector CT imaging of the chest was performed using the standard protocol during bolus administration of intravenous contrast. Multiplanar CT image reconstructions and MIPs were obtained to evaluate the vascular anatomy. RADIATION DOSE REDUCTION: This exam was performed according to the departmental dose-optimization program which includes automated exposure control, adjustment of the mA and/or kV according to patient size and/or use of iterative reconstruction technique. CONTRAST:  58m OMNIPAQUE IOHEXOL 350 MG/ML SOLN COMPARISON:  None Available.  FINDINGS: Cardiovascular: Satisfactory opacification of the pulmonary arteries to the segmental level. No evidence of pulmonary embolism. Normal heart size. No pericardial effusion. Mediastinum/Nodes: Multiple subcentimeter vascular space and right paratracheal lymph nodes, likely reactive. Lungs/Pleura: Right basilar atelectasis or infiltrate. Small bilateral pleural effusions, right greater than the left. There are reticulonodular opacities suggesting infectious/inflammatory process. Calcified granuloma in the anterior aspect of the right lower lobe. Upper Abdomen: No acute abnormality. Musculoskeletal: No chest wall abnormality. No acute or significant osseous findings. Review of the MIP images confirms the above findings. IMPRESSION: 1.  No evidence of pulmonary embolism. 2. Scattered reticulonodular opacities and right basilar atelectasis and small bilateral pleural effusions concerning for infectious/inflammatory process including pneumonia. Follow-up examination to resolution is recommended. Electronically Signed   By: IKeane PoliceD.O.   On: 03/29/2022 13:00   DG Chest Portable 1 View  Result Date: 03/29/2022 CLINICAL DATA:  Shortness of breath. EXAM: PORTABLE CHEST 1 VIEW COMPARISON:  None Available. FINDINGS: 1039 hours. The cardio pericardial silhouette is enlarged. There is pulmonary vascular congestion without overt pulmonary edema. Subtle bibasilar atelectasis or infiltrate noted, left greater than right. Telemetry leads overlie the chest. IMPRESSION: 1. Pulmonary vascular congestion without overt pulmonary edema. 2. Subtle bibasilar atelectasis or infiltrate, left greater than right. Electronically Signed   By: EMisty StanleyM.D.   On: 03/29/2022 10:52       LOS: 1 day   [facility] Pager on [email or web address]  03/30/2022, 10:15 AM

## 2022-03-30 NOTE — Progress Notes (Signed)
Pt called nurse and stated having a sharp pain twice in the middle of his chest within secs of each other.  Vitals and EKG completed.   Rhonda PA notified.   Pt stated not having any chest pain at this time.     03/30/22 1901  Vitals  BP (!) 140/98  MAP (mmHg) 111  BP Location Right Arm  BP Method Automatic  Patient Position (if appropriate) Lying  Pulse Rate (!) 108  Pulse Rate Source Monitor  Resp 18   Pt stated feeling anxious.  Emotional support given.  Family at bedside.

## 2022-03-30 NOTE — TOC Progression Note (Signed)
Transition of Care [facility]) - Progression Note    Patient Details  Name: Aaron Crawford MRN: [id number] Date of Birth: 03-05-1961  Transition of Care [facility] CM/SW Contact  Zenon Mayo, RN Phone Number: 03/30/2022, 3:10 PM  Clinical Narrative:     from home, new afib, diuresing with po meds. Anasarca bil le plus 2 edema, hx chf, poss PNA.   TOC following.         Expected Discharge Plan and Services                                                 Social Determinants of Health (SDOH) Interventions    Readmission Risk Interventions     No data to display

## 2022-03-30 NOTE — Consult Note (Addendum)
Cardiology Consultation:   Patient ID: Aaron Crawford MRN: [id number]; DOB: [birth year]  Admit date: 03/29/2022 Date of Consult: 03/30/2022  PCP:  Silverio Decamp, MD   [place] Providers Cardiologist:  None   }     Patient Profile:   Aaron Crawford is a 61 y.o. male with a hx of untreated HTN, HLD who is being seen 03/30/2022 for the evaluation of atrial fibrillation at the request of Dr. Nevada Crane.  History of Present Illness:   Aaron Crawford is a 61 year old male with above medical history who does not follow with cardiology.   Patient presented to urden care on 7/5 complaining of progressive SOB for 1 week. Reportedly worse with exertion or when laying oh his back. Denies chest pain. EKG at urgent care showed new atrial fibrillation with RVR. Patient was brought to [facility] ED by EMS. He was given IV diltiazem by EMS.   In the [facility] ED, labs showed Na 138, K 4.1, creatinine 0.76, mag 1.8, WBC 8.6, hemoglobin 14.1, platelets 278. HsTn negative x2 (6>>6). BNP elevated to 439.0. EKG showed atrial fibrillation, HR 99 BPM, PVC present. CXR showed pulmonary vascular congestion without overt pulmonary edema. CT Angio chest showed no evidence of pulmonary embolism, scattered reticulonodular opacities and right basilar atelectasis and small bilateral pleural effusions concerning for infections process including pneumonia. COVID negative. Patient was started on empiric antibiotics for CAP and was given a dose of IV lasix 40 mg IV. Patient was admitted to [facility] and cardiology was asked to consult.   On interview, patient reports that he does not have any past cardiac history and does not take any medications regularly. Denies tobacco use, drinks alcohol socially. Does not have a family history of heart problems. Over the past week, patient had noticed progressive orthopnea, dyspnea on exertion, ankle edema, and abdominal distention. Denies any chest pain, palpitations, dizziness/lightheadedness.     Past Medical History:  Diagnosis Date   Hernia    Hyperlipidemia     History reviewed. No pertinent surgical history.   Home Medications:  Prior to Admission medications   Medication Sig Start Date End Date Taking? Authorizing Provider  GAMMA AMINOBUTYRIC ACID PO Take 750 mg by mouth daily.   Yes [provider]  ibuprofen (ADVIL) 200 MG tablet Take 200 mg by mouth every 6 (six) hours as needed for mild pain or headache.   Yes [provider]  doxycycline (VIBRAMYCIN) 100 MG capsule Take 1 capsule (100 mg total) by mouth 2 (two) times daily for 10 days. Patient not taking: Reported on 03/29/2022 03/29/22 04/08/22  Eliezer Lofts, FNP    Inpatient Medications: Scheduled Meds:  apixaban  5 mg Oral BID   furosemide  20 mg Oral Daily   metoprolol tartrate  12.5 mg Oral BID   Continuous Infusions:  sodium chloride Stopped (03/29/22 1401)   PRN Meds: sodium chloride  Allergies:    Allergies  Allergen Reactions   Ambien [Zolpidem] Other (See Comments)    Panic Attack   Penicillins Other (See Comments)    "i've always been told since I was a kid, that I am allergic, but I don't know what type of reaction."   Percocet [Oxycodone-Acetaminophen] Anxiety    Social History:   Social History   Socioeconomic History   Marital status: Married    Spouse name: [name]   Number of children: 4   Years of education: 11   Highest education level: Not on file  Occupational History  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  Tobacco Use   Smoking status: Former    Packs/day: 1.00    Years: 10.00    Total pack years: 10.00    Types: Cigarettes    Quit date: 02/22/1989    Years since quitting: 33.1   Smokeless tobacco: Never  Vaping Use   Vaping Use: Never used  Substance and Sexual Activity   Alcohol use: Not Currently   Drug use: No   Sexual activity: Yes    Birth control/protection: None  Other Topics Concern   Not on file  Social History  Narrative   Not on file   Social Determinants of Health   Financial Resource Strain: Not on file  Food Insecurity: Not on file  Transportation Needs: Not on file  Physical Activity: Not on file  Stress: Not on file  Social Connections: Not on file  Intimate Partner Violence: Not on file    Family History:    Family History  Problem Relation Age of Onset   Alcohol abuse Mother    Cancer Mother    Cancer Father    Heart disease Father    Diabetes Sister    Colon cancer Neg Hx    Colon polyps Neg Hx    Esophageal cancer Neg Hx    Rectal cancer Neg Hx    Stomach cancer Neg Hx      ROS:  Please see the history of present illness.   All other ROS reviewed and negative.     Physical Exam/Data:   Vitals:   03/30/22 0200 03/30/22 0400 03/30/22 0537 03/30/22 0712  BP: 101/75 97/61  (!) 128/101  Pulse:    91  Resp:  15  18  Temp:  98.1 F (36.7 C)  98.1 F (36.7 C)  TempSrc:  Oral  Oral  SpO2:  97%  97%  Weight:   87.7 kg   Height:        Intake/Output Summary (Last 24 hours) at 03/30/2022 1346 Last data filed at 03/30/2022 1314 Gross per 24 hour  Intake 1187.45 ml  Output 3300 ml  Net -2112.55 ml      03/30/2022    5:37 AM 03/29/2022    6:24 PM 03/29/2022   10:33 AM  Last 3 Weights  Weight (lbs) 193 lb 6.4 oz 195 lb 5.2 oz 198 lb  Weight (kg) 87.726 kg 88.6 kg 89.812 kg     Body mass index is 26.97 kg/m.  General:  Well nourished, well developed, in no acute distress HEENT: normal Neck: no JVD Vascular: Radial pulses 2+ bilaterally Cardiac:  normal S1, S2; irregular rate and rhythm  Lungs:  Fine crackles in bilateral lung bases  Abd: soft, nontender, no hepatomegaly  Ext: no edema Musculoskeletal:  No deformities, BUE and BLE strength normal and equal Skin: warm and dry  Neuro:  CNs 2-12 intact, no focal abnormalities noted Psych:  Normal affect   EKG:  The EKG was personally reviewed and demonstrates:  Atrial fibrillation, HR 99 BPM, PVC present   Telemetry:  Telemetry was personally reviewed and demonstrates:  Atrial Fibrillation, HR in the 80s-110s   Relevant CV Studies:   Laboratory Data:  High Sensitivity Troponin:   Recent Labs  Lab 03/29/22 1037 03/29/22 1240  TROPONINIHS 6 6     Chemistry Recent Labs  Lab 03/29/22 1037 03/30/22 0307  NA 138 142  K 4.1 4.4  CL 108 106  CO2 23 27  GLUCOSE 125*  102*  BUN 11 11  CREATININE 0.76 1.01  CALCIUM 9.1 9.5  MG 1.8 2.1  GFRNONAA >60 >60  ANIONGAP 7 9    Recent Labs  Lab 03/29/22 1037 03/30/22 0307  PROT 7.0 6.3*  ALBUMIN 4.0 3.7  AST 36 26  ALT 75* 62*  ALKPHOS 73 65  BILITOT 1.2 1.0   Lipids  Recent Labs  Lab 03/30/22 0307  CHOL 157  TRIG 75  HDL 51  LDLCALC 91  CHOLHDL 3.1    Hematology Recent Labs  Lab 03/29/22 1037 03/30/22 0307  WBC 8.6 8.0  RBC 5.05 4.93  HGB 14.1 13.7  HCT 43.2 42.7  MCV 85.5 86.6  MCH 27.9 27.8  MCHC 32.6 32.1  RDW 14.4 14.3  PLT 278 248   Thyroid  Recent Labs  Lab 03/29/22 1037  TSH 1.120    BNP Recent Labs  Lab 03/29/22 1041  BNP 439.0*    DDimer  Recent Labs  Lab 03/29/22 1037  DDIMER 1.64*     Radiology/Studies:  CT Angio Chest PE W and/or Wo Contrast  Result Date: 03/29/2022 CLINICAL DATA:  Shortness of breath. Sent from urgent care. Atrial fibrillation. EXAM: CT ANGIOGRAPHY CHEST WITH CONTRAST TECHNIQUE: Multidetector CT imaging of the chest was performed using the standard protocol during bolus administration of intravenous contrast. Multiplanar CT image reconstructions and MIPs were obtained to evaluate the vascular anatomy. RADIATION DOSE REDUCTION: This exam was performed according to the departmental dose-optimization program which includes automated exposure control, adjustment of the mA and/or kV according to patient size and/or use of iterative reconstruction technique. CONTRAST:  82m OMNIPAQUE IOHEXOL 350 MG/ML SOLN COMPARISON:  None Available. FINDINGS: Cardiovascular: Satisfactory  opacification of the pulmonary arteries to the segmental level. No evidence of pulmonary embolism. Normal heart size. No pericardial effusion. Mediastinum/Nodes: Multiple subcentimeter vascular space and right paratracheal lymph nodes, likely reactive. Lungs/Pleura: Right basilar atelectasis or infiltrate. Small bilateral pleural effusions, right greater than the left. There are reticulonodular opacities suggesting infectious/inflammatory process. Calcified granuloma in the anterior aspect of the right lower lobe. Upper Abdomen: No acute abnormality. Musculoskeletal: No chest wall abnormality. No acute or significant osseous findings. Review of the MIP images confirms the above findings. IMPRESSION: 1.  No evidence of pulmonary embolism. 2. Scattered reticulonodular opacities and right basilar atelectasis and small bilateral pleural effusions concerning for infectious/inflammatory process including pneumonia. Follow-up examination to resolution is recommended. Electronically Signed   By: IKeane PoliceD.O.   On: 03/29/2022 13:00   DG Chest Portable 1 View  Result Date: 03/29/2022 CLINICAL DATA:  Shortness of breath. EXAM: PORTABLE CHEST 1 VIEW COMPARISON:  None Available. FINDINGS: 1039 hours. The cardio pericardial silhouette is enlarged. There is pulmonary vascular congestion without overt pulmonary edema. Subtle bibasilar atelectasis or infiltrate noted, left greater than right. Telemetry leads overlie the chest. IMPRESSION: 1. Pulmonary vascular congestion without overt pulmonary edema. 2. Subtle bibasilar atelectasis or infiltrate, left greater than right. Electronically Signed   By: EMisty StanleyM.D.   On: 03/29/2022 10:52     Assessment and Plan:   New Onset Atrial Fibrillation  - Patient presented to urgent care complaining of SOB on exertion and orthopnea. Found to be in new onset afib with RVR. Was given IV diltiazem that has since been discontinued  - Per telemetry- patient remains in Afib with  HR in the 80s-110s  - Now on lopressor 12.5 mg BID -- increase to 25 mg BID  - CHADS-VASc 2 (HTN, CHF)  -  Has been started on eliquis 5 mg BID  - Echocardiogram Pending  - Patient is overall asymptomatic-- suspect patient could be on [facility] for 4 weeks followed by DCCV if EF normal. If EF reduced, may do TEE guided cardioversion this admission   Dyspnea on Exertion, Orthopnea  Possible CHF  - Patient denies having any cardiac history  - Now, presents complaining of SOB on exertion, orthopnea, ankle edema, abdominal distention   - BNP elevated to 439 - CT chest with small bilateral pleural effusions, scattered reticulonodular opacities and right basilar atelectasis  - Echocardiogram pending. Possible symptoms/volume is secondary to afib, however could also be a component of reduced EF or diastolic dysfunction  - Was given one dose of IV lasix 40 mg yesterday, IV lasix 20 mg today  - Output 3.3 L urine and is currently net -2.1 L since admission. Reports improvement in symptoms with diuresis     Risk Assessment/Risk Scores:        New York Heart Association (NYHA) Functional Class NYHA Class II  CHA2DS2-VASc Score = 2  This indicates a 2.2% annual risk of stroke. The patient's score is based upon: CHF History: 1 HTN History: 1 Diabetes History: 0 Stroke History: 0 Vascular Disease History: 0 Age Score: 0 Gender Score: 0       For questions or updates, please contact [place] [employer] [place] Please consult [email or web address] for contact info under    Signed, Margie Billet, PA-C  03/30/2022 1:46 PM  Personally seen and examined. Agree with APP above with the following comments:  Briefly 61 yo M with no prior medical problems    who presents with new HFrEF  Patient notes that he was doing well until two weeks ago.  Started to having some bloating.  May have had some weight gain.  Worsening PND this week lead to ED visit.  Found to have AF RVR.  Found to have new HF.  Found to have  small mLAD calcification.  No PE  We have discussed his alcohol history; he has had a significant decrease in his alcohol intake; down to ~ 2 beers a day at best average.  Exam notable for IRIR tachycardia, has HJR and decreased breath sounds right base.  No LE edema, otherwise as above.  Labs notable for  EKG AF rate 92 septal infarct Tele: Af rates ~ 100  Would recommend   Atrial Fibrillation of unclear duration  - Risk factors include new HF and vascular calcium - CHADSVASC=2. - Continue anticoagulation with eliquis; Acquired Thrombophilia - TSH WNL, imaging notable for no left atrial dilation - discussed alcohol use - discussed exercise as preventive factors - will increase to succinate 50 mg PO daily starting tomorrow - increase to lasix 40 mg PO daily - will add aldactone at low dose - we will not add ARNI or SGLT2i this admission; or start statin therapy for his coronary calcium; I worry he has significant distrust of the medical system and we will need to ease him into things to have trust and retention  I have consented him for TEE/DCCV; he asked to be on for later in the day as he wants to do his own research before committing 100% and wants to talk about it with his wife and daughter  Rudean Haskell, MD [address] [place], [place] [address] [phone]  5:00 PM

## 2022-03-30 NOTE — Plan of Care (Signed)
  Problem: Clinical Measurements: Goal: Will remain free from infection Outcome: Completed/Met   Problem: Activity: Goal: Risk for activity intolerance will decrease Outcome: Completed/Met   Problem: Nutrition: Goal: Adequate nutrition will be maintained Outcome: Completed/Met   Problem: Elimination: Goal: Will not experience complications related to bowel motility Outcome: Completed/Met Goal: Will not experience complications related to urinary retention Outcome: Completed/Met   Problem: Pain Managment: Goal: General experience of comfort will improve Outcome: Completed/Met   Problem: Safety: Goal: Ability to remain free from injury will improve Outcome: Completed/Met   Problem: Skin Integrity: Goal: Risk for impaired skin integrity will decrease Outcome: Completed/Met   

## 2022-03-31 ENCOUNTER — Encounter (HOSPITAL_COMMUNITY): Payer: Self-pay | Admitting: Internal Medicine

## 2022-03-31 ENCOUNTER — Inpatient Hospital Stay (HOSPITAL_COMMUNITY): Payer: 59 | Admitting: Anesthesiology

## 2022-03-31 ENCOUNTER — Encounter (HOSPITAL_COMMUNITY)
Admission: EM | Disposition: A | Discharge status: Home / Self Care | Payer: Self-pay | Source: Home / Self Care | Attending: Internal Medicine

## 2022-03-31 ENCOUNTER — Other Ambulatory Visit (HOSPITAL_COMMUNITY): Payer: Self-pay

## 2022-03-31 ENCOUNTER — Inpatient Hospital Stay (HOSPITAL_COMMUNITY)
Admit: 2022-03-31 | Discharge: 2022-03-31 | Disposition: A | Discharge status: Home / Self Care | Payer: 59 | Attending: Cardiology | Admitting: Cardiology

## 2022-03-31 DIAGNOSIS — Z87891 Personal history of nicotine dependence: Secondary | ICD-10-CM | POA: Diagnosis not present

## 2022-03-31 DIAGNOSIS — I340 Nonrheumatic mitral (valve) insufficiency: Secondary | ICD-10-CM

## 2022-03-31 DIAGNOSIS — I4891 Unspecified atrial fibrillation: Secondary | ICD-10-CM | POA: Diagnosis not present

## 2022-03-31 DIAGNOSIS — I5021 Acute systolic (congestive) heart failure: Secondary | ICD-10-CM | POA: Diagnosis present

## 2022-03-31 DIAGNOSIS — I083 Combined rheumatic disorders of mitral, aortic and tricuspid valves: Secondary | ICD-10-CM | POA: Diagnosis not present

## 2022-03-31 DIAGNOSIS — I10 Essential (primary) hypertension: Secondary | ICD-10-CM

## 2022-03-31 HISTORY — PX: TEE WITHOUT CARDIOVERSION: SHX5443

## 2022-03-31 HISTORY — PX: CARDIOVERSION: SHX1299

## 2022-03-31 LAB — BASIC METABOLIC PANEL
Anion gap: 7 (ref 5–15)
BUN: 13 mg/dL (ref 6–20)
CO2: 30 mmol/L (ref 22–32)
Calcium: 9.4 mg/dL (ref 8.9–10.3)
Chloride: 104 mmol/L (ref 98–111)
Creatinine, Ser: 1.06 mg/dL (ref 0.61–1.24)
GFR, Estimated: >60 mL/min (ref >60)
Glucose, Bld: 116 mg/dL — ABNORMAL HIGH (ref 70–99)
Potassium: 4.9 mmol/L (ref 3.5–5.1)
Sodium: 141 mmol/L (ref 135–145)

## 2022-03-31 LAB — CBC
HCT: 45.6 % (ref 39.0–52.0)
Hemoglobin: 14.6 g/dL (ref 13.0–17.0)
MCH: 27.8 pg (ref 26.0–34.0)
MCHC: 32 g/dL (ref 30.0–36.0)
MCV: 86.9 fL (ref 80.0–100.0)
Platelets: 301 10*3/uL (ref 150–400)
RBC: 5.25 MIL/uL (ref 4.22–5.81)
RDW: 14.1 % (ref 11.5–15.5)
WBC: 9 10*3/uL (ref 4.0–10.5)
nRBC: 0 % (ref 0.0–0.2)

## 2022-03-31 LAB — HEPATITIS PANEL, ACUTE
HCV Ab: NONREACTIVE
Hep A IgM: NONREACTIVE
Hep B C IgM: NONREACTIVE
Hepatitis B Surface Ag: NONREACTIVE

## 2022-03-31 SURGERY — ECHOCARDIOGRAM, TRANSESOPHAGEAL
Anesthesia: Monitor Anesthesia Care

## 2022-03-31 MED ORDER — SPIRONOLACTONE 25 MG PO TABS: 12.5000 mg | ORAL_TABLET | Freq: Every day | ORAL | 2 refills | Status: DC

## 2022-03-31 MED ORDER — AMIODARONE HCL 200 MG PO TABS: ORAL_TABLET | ORAL | 1 refills | Status: DC

## 2022-03-31 MED ORDER — LOSARTAN POTASSIUM 25 MG PO TABS
12.5000 mg | ORAL_TABLET | Freq: Every day | ORAL | Status: DC
  Administered 2022-03-31: 12.5 mg via ORAL
  Filled 2022-03-31: qty 1

## 2022-03-31 MED ORDER — LIDOCAINE 2% (20 MG/ML) 5 ML SYRINGE
INTRAMUSCULAR | Status: DC | PRN
  Administered 2022-03-31: 100 mg via INTRAVENOUS

## 2022-03-31 MED ORDER — PROPOFOL 500 MG/50ML IV EMUL
INTRAVENOUS | Status: DC | PRN
  Administered 2022-03-31: 150 ug/kg/min via INTRAVENOUS

## 2022-03-31 MED ORDER — PROPOFOL 10 MG/ML IV BOLUS
INTRAVENOUS | Status: DC | PRN
  Administered 2022-03-31: 20 mg via INTRAVENOUS

## 2022-03-31 MED ORDER — FUROSEMIDE 40 MG PO TABS: 40.0000 mg | ORAL_TABLET | Freq: Every day | ORAL | 2 refills | Status: DC

## 2022-03-31 MED ORDER — APIXABAN 5 MG PO TABS
5.0000 mg | ORAL_TABLET | Freq: Two times a day (BID) | ORAL | 0 refills | Status: DC
  Filled 2022-03-31: qty 60, 30d supply, fill #0

## 2022-03-31 MED ORDER — LOSARTAN POTASSIUM 25 MG PO TABS: 12.5000 mg | ORAL_TABLET | Freq: Every day | ORAL | 2 refills | Status: DC

## 2022-03-31 MED ORDER — AMIODARONE HCL 200 MG PO TABS
400.0000 mg | ORAL_TABLET | Freq: Two times a day (BID) | ORAL | Status: DC
  Administered 2022-03-31: 400 mg via ORAL
  Filled 2022-03-31: qty 2

## 2022-03-31 MED ORDER — APIXABAN 5 MG PO TABS: 5.0000 mg | ORAL_TABLET | Freq: Two times a day (BID) | ORAL | 2 refills | Status: DC

## 2022-03-31 MED ORDER — METOPROLOL SUCCINATE ER 50 MG PO TB24: 50.0000 mg | ORAL_TABLET | Freq: Every day | ORAL | 2 refills | Status: DC

## 2022-03-31 MED ORDER — AMIODARONE HCL 200 MG PO TABS: 200.0000 mg | ORAL_TABLET | Freq: Every day | ORAL | Status: DC

## 2022-03-31 NOTE — Anesthesia Preprocedure Evaluation (Signed)
Anesthesia Evaluation  Patient identified by MRN, date of birth, ID band Patient awake    Reviewed: Allergy & Precautions, NPO status , Patient's Chart, lab work & pertinent test results  Airway Mallampati: II  TM Distance: >3 FB Neck ROM: Full    Dental no notable dental hx. (+) Teeth Intact, Dental Advisory Given   Pulmonary former smoker,    Pulmonary exam normal breath sounds clear to auscultation       Cardiovascular hypertension, Normal cardiovascular exam+ dysrhythmias Atrial Fibrillation  Rhythm:Regular Rate:Normal     Neuro/Psych negative neurological ROS     GI/Hepatic negative GI ROS, Neg liver ROS,   Endo/Other  negative endocrine ROS  Renal/GU negative Renal ROS     Musculoskeletal   Abdominal   Peds  Hematology negative hematology ROS (+)   Anesthesia Other Findings All: PC, Ambien, Percocet  Reproductive/Obstetrics                             Anesthesia Physical Anesthesia Plan  ASA: 2  Anesthesia Plan: MAC   Post-op Pain Management:    Induction:   PONV Risk Score and Plan: Treatment may vary due to age or medical condition and Ondansetron  Airway Management Planned: Natural Airway and Nasal Cannula  Additional Equipment:   Intra-op Plan:   Post-operative Plan:   Informed Consent: I have reviewed the patients History and Physical, chart, labs and discussed the procedure including the risks, benefits and alternatives for the proposed anesthesia with the patient or authorized representative who has indicated his/her understanding and acceptance.     Dental advisory given  Plan Discussed with: CRNA  Anesthesia Plan Comments:         Anesthesia Quick Evaluation

## 2022-03-31 NOTE — Transfer of Care (Signed)
Immediate Anesthesia Transfer of Care Note  Patient: Aaron Crawford  Procedure(s) Performed: TRANSESOPHAGEAL ECHOCARDIOGRAM (TEE) CARDIOVERSION  Patient Location: Endoscopy Unit  Anesthesia Type:MAC  Level of Consciousness: drowsy  Airway & Oxygen Therapy: Patient Spontanous Breathing and Patient connected to nasal cannula oxygen  Post-op Assessment: Report given to RN and Post -op Vital signs reviewed and stable  Post vital signs: Reviewed and stable  Last Vitals:  Vitals Value Taken Time  BP 103/79 03/31/22 1336  Temp    Pulse 111 03/31/22 1337  Resp 26 03/31/22 1337  SpO2 89 % 03/31/22 1337  Vitals shown include unvalidated device data.  Last Pain:  Vitals:   03/31/22 1200  TempSrc: Temporal  PainSc: 0-No pain         Complications: No notable events documented.

## 2022-03-31 NOTE — Anesthesia Postprocedure Evaluation (Signed)
Anesthesia Post Note  Patient: Aaron Crawford  Procedure(s) Performed: TRANSESOPHAGEAL ECHOCARDIOGRAM (TEE) CARDIOVERSION     Patient location during evaluation: Endoscopy Anesthesia Type: MAC Level of consciousness: awake and alert Pain management: pain level controlled Vital Signs Assessment: post-procedure vital signs reviewed and stable Respiratory status: spontaneous breathing, nonlabored ventilation, respiratory function stable and patient connected to nasal cannula oxygen Cardiovascular status: blood pressure returned to baseline and stable Postop Assessment: no apparent nausea or vomiting Anesthetic complications: no   No notable events documented.  Last Vitals:  Vitals:   03/31/22 1400 03/31/22 1401  BP:  (!) 119/93  Pulse: (!) 109 (!) 109  Resp: (!) 21 (!) 24  Temp:    SpO2: 93% 92%    Last Pain:  Vitals:   03/31/22 1400  TempSrc:   PainSc: 0-No pain                 Barnet Glasgow

## 2022-03-31 NOTE — Progress Notes (Signed)
Progress Note  Patient Name: Aaron Crawford Date of Encounter: 03/31/2022  Primary Cardiologist: New to [facility]  Subjective   Overnight had some chest pain. Patient notes now that he feels back to normal No CP, SOB, Palpitations.  Inpatient Medications    Scheduled Meds:  apixaban  5 mg Oral BID   furosemide  40 mg Oral Daily   metoprolol succinate  50 mg Oral Daily   spironolactone  12.5 mg Oral Daily   Continuous Infusions:  sodium chloride Stopped (03/29/22 1401)   PRN Meds: sodium chloride, diphenhydrAMINE   Vital Signs    Vitals:   03/30/22 1940 03/31/22 0136 03/31/22 0430 03/31/22 0729  BP: (!) 135/96 128/81 124/90 (!) 121/94  Pulse: (!) 106 99 95 71  Resp: '18 18 18 19  '$ Temp: 98.2 F (36.8 C) 98.7 F (37.1 C) 97.8 F (36.6 C) 98 F (36.7 C)  TempSrc: Oral Oral Oral Oral  SpO2: 98% 96% 98% 92%  Weight:   87.7 kg   Height:        Intake/Output Summary (Last 24 hours) at 03/31/2022 0916 Last data filed at 03/31/2022 0500 Gross per 24 hour  Intake 676 ml  Output 3025 ml  Net -2349 ml   Filed Weights   03/29/22 1824 03/30/22 0537 03/31/22 0430  Weight: 88.6 kg 87.7 kg 87.7 kg    Telemetry    AF rates improved, < 110  - Personally Reviewed  ECG    AF rate 85 rare PVC septal infarct pattern - Personally Reviewed  Physical Exam   Gen: no distress   Neck: No JVD,  Cardiac: No Rubs or Gallops, No murmur, IRIR tachycardia, +2 radial pulses Respiratory: Clear to auscultation bilaterally, normal effort, normal  respiratory rate GI: Soft, nontender, non-distended  MS: No  edema;  moves all extremities Integument: Skin feels warm Neuro:  At time of evaluation, alert and oriented to person/place/time/situation  Psych: Normal affect, patient feels well   Labs    Chemistry Recent Labs  Lab 03/29/22 1037 03/30/22 0307 03/31/22 0216  NA 138 142 141  K 4.1 4.4 4.9  CL 108 106 104  CO2 '23 27 30  '$ GLUCOSE 125* 102* 116*  BUN '11 11 13   '$ CREATININE 0.76 1.01 1.06  CALCIUM 9.1 9.5 9.4  PROT 7.0 6.3*  --   ALBUMIN 4.0 3.7  --   AST 36 26  --   ALT 75* 62*  --   ALKPHOS 73 65  --   BILITOT 1.2 1.0  --   GFRNONAA >60 >60 >60  ANIONGAP '7 9 7     '$ Hematology Recent Labs  Lab 03/29/22 1037 03/30/22 0307 03/31/22 0216  WBC 8.6 8.0 9.0  RBC 5.05 4.93 5.25  HGB 14.1 13.7 14.6  HCT 43.2 42.7 45.6  MCV 85.5 86.6 86.9  MCH 27.9 27.8 27.8  MCHC 32.6 32.1 32.0  RDW 14.4 14.3 14.1  PLT 278 248 301    Cardiac EnzymesNo results for input(s): "TROPONINI" in the last 168 hours. No results for input(s): "TROPIPOC" in the last 168 hours.   BNP Recent Labs  Lab 03/29/22 1041  BNP 439.0*     DDimer  Recent Labs  Lab 03/29/22 1037  DDIMER 1.64*     Radiology    ECHOCARDIOGRAM COMPLETE  Result Date: 03/30/2022    ECHOCARDIOGRAM REPORT   Patient Name:   Aaron Crawford Date of Exam: 03/30/2022 Medical Rec #:  [id number]   Height:  71.0 in Accession #:    [id number]  Weight:       193.4 lb Date of Birth:  Sep 27, 1960  BSA:          2.079 m Patient Age:    61 years    BP:           97/61 mmHg Patient Gender: M           HR:           95 bpm. Exam Location:  Inpatient Procedure: 2D Echo, Cardiac Doppler and Color Doppler Indications:    Afib  History:        Patient has no prior history of Echocardiogram examinations.  Sonographer:    Jyl Heinz Referring Phys: [id number] [place]  1. Left ventricular ejection fraction, by estimation, is 35 to 40%. The left ventricle has moderately decreased function. The left ventricle demonstrates global hypokinesis. Left ventricular diastolic parameters are indeterminate.  2. Right ventricular systolic function is normal. The right ventricular size is normal. There is normal pulmonary artery systolic pressure. The estimated right ventricular systolic pressure is 31.5 mmHg.  3. The mitral valve is normal in structure. Mild mitral valve regurgitation. No evidence of mitral stenosis.   4. The aortic valve is normal in structure. Aortic valve regurgitation is not visualized. No aortic stenosis is present.  5. The inferior vena cava is normal in size with greater than 50% respiratory variability, suggesting right atrial pressure of 3 mmHg. FINDINGS  Left Ventricle: Left ventricular ejection fraction, by estimation, is 35 to 40%. The left ventricle has moderately decreased function. The left ventricle demonstrates global hypokinesis. The left ventricular internal cavity size was normal in size. There is no left ventricular hypertrophy. Left ventricular diastolic parameters are indeterminate. Right Ventricle: The right ventricular size is normal. No increase in right ventricular wall thickness. Right ventricular systolic function is normal. There is normal pulmonary artery systolic pressure. The tricuspid regurgitant velocity is 2.37 m/s, and  with an assumed right atrial pressure of 3 mmHg, the estimated right ventricular systolic pressure is 17.6 mmHg. Left Atrium: Left atrial size was normal in size. Right Atrium: Right atrial size was normal in size. Pericardium: There is no evidence of pericardial effusion. Mitral Valve: The mitral valve is normal in structure. Mild mitral annular calcification. Mild mitral valve regurgitation. No evidence of mitral valve stenosis. Tricuspid Valve: The tricuspid valve is normal in structure. Tricuspid valve regurgitation is trivial. No evidence of tricuspid stenosis. Aortic Valve: The aortic valve is normal in structure. Aortic valve regurgitation is not visualized. No aortic stenosis is present. Aortic valve peak gradient measures 2.6 mmHg. Pulmonic Valve: The pulmonic valve was normal in structure. Pulmonic valve regurgitation is mild. No evidence of pulmonic stenosis. Aorta: The aortic root is normal in size and structure. Venous: The inferior vena cava is normal in size with greater than 50% respiratory variability, suggesting right atrial pressure of 3  mmHg. IAS/Shunts: No atrial level shunt detected by color flow Doppler.  LEFT VENTRICLE PLAX 2D LVIDd:         5.10 cm      Diastology LVIDs:         4.10 cm      LV e' medial:    6.85 cm/s LV PW:         1.10 cm      LV E/e' medial:  11.9 LV IVS:        0.90 cm  LV e' lateral:   8.05 cm/s LVOT diam:     2.00 cm      LV E/e' lateral: 10.2 LV SV:         36 LV SV Index:   18 LVOT Area:     3.14 cm  LV Volumes (MOD) LV vol d, MOD A2C: 129.0 ml LV vol d, MOD A4C: 107.0 ml LV vol s, MOD A2C: 75.4 ml LV vol s, MOD A4C: 62.0 ml LV SV MOD A2C:     53.6 ml LV SV MOD A4C:     107.0 ml LV SV MOD BP:      51.9 ml RIGHT VENTRICLE            IVC RV Basal diam:  3.30 cm    IVC diam: 2.30 cm RV Mid diam:    2.70 cm RV S prime:     7.18 cm/s TAPSE (M-mode): 1.5 cm LEFT ATRIUM             Index        RIGHT ATRIUM           Index LA diam:        4.60 cm 2.21 cm/m   RA Area:     16.70 cm LA Vol (A2C):   57.4 ml 27.61 ml/m  RA Volume:   41.00 ml  19.72 ml/m LA Vol (A4C):   52.9 ml 25.45 ml/m LA Biplane Vol: 54.9 ml 26.41 ml/m  AORTIC VALVE AV Area (Vmax): 2.64 cm AV Vmax:        80.10 cm/s AV Peak Grad:   2.6 mmHg LVOT Vmax:      67.20 cm/s LVOT Vmean:     49.900 cm/s LVOT VTI:       0.116 m  AORTA Ao Root diam: 3.90 cm Ao Asc diam:  3.30 cm MITRAL VALVE               TRICUSPID VALVE MV Area (PHT): 6.48 cm    TR Peak grad:   22.5 mmHg MV Decel Time: 117 msec    TR Vmax:        237.00 cm/s MR Peak grad: 72.9 mmHg MR Mean grad: 61.0 mmHg    SHUNTS MR Vmax:      427.00 cm/s  Systemic VTI:  0.12 m MR Vmean:     379.0 cm/s   Systemic Diam: 2.00 cm MV E velocity: 81.80 cm/s Candee Furbish MD Electronically signed by Candee Furbish MD Signature Date/Time: 03/30/2022/3:48:25 PM    Final    CT Angio Chest PE W and/or Wo Contrast  Result Date: 03/29/2022 CLINICAL DATA:  Shortness of breath. Sent from urgent care. Atrial fibrillation. EXAM: CT ANGIOGRAPHY CHEST WITH CONTRAST TECHNIQUE: Multidetector CT imaging of the chest was performed  using the standard protocol during bolus administration of intravenous contrast. Multiplanar CT image reconstructions and MIPs were obtained to evaluate the vascular anatomy. RADIATION DOSE REDUCTION: This exam was performed according to the departmental dose-optimization program which includes automated exposure control, adjustment of the mA and/or kV according to patient size and/or use of iterative reconstruction technique. CONTRAST:  26m OMNIPAQUE IOHEXOL 350 MG/ML SOLN COMPARISON:  None Available. FINDINGS: Cardiovascular: Satisfactory opacification of the pulmonary arteries to the segmental level. No evidence of pulmonary embolism. Normal heart size. No pericardial effusion. Mediastinum/Nodes: Multiple subcentimeter vascular space and right paratracheal lymph nodes, likely reactive. Lungs/Pleura: Right basilar atelectasis or infiltrate. Small bilateral pleural effusions, right greater than the left. There are  reticulonodular opacities suggesting infectious/inflammatory process. Calcified granuloma in the anterior aspect of the right lower lobe. Upper Abdomen: No acute abnormality. Musculoskeletal: No chest wall abnormality. No acute or significant osseous findings. Review of the MIP images confirms the above findings. IMPRESSION: 1.  No evidence of pulmonary embolism. 2. Scattered reticulonodular opacities and right basilar atelectasis and small bilateral pleural effusions concerning for infectious/inflammatory process including pneumonia. Follow-up examination to resolution is recommended. Electronically Signed   By: Keane Police D.O.   On: 03/29/2022 13:00   DG Chest Portable 1 View  Result Date: 03/29/2022 CLINICAL DATA:  Shortness of breath. EXAM: PORTABLE CHEST 1 VIEW COMPARISON:  None Available. FINDINGS: 1039 hours. The cardio pericardial silhouette is enlarged. There is pulmonary vascular congestion without overt pulmonary edema. Subtle bibasilar atelectasis or infiltrate noted, left greater than  right. Telemetry leads overlie the chest. IMPRESSION: 1. Pulmonary vascular congestion without overt pulmonary edema. 2. Subtle bibasilar atelectasis or infiltrate, left greater than right. Electronically Signed   By: Misty Stanley M.D.   On: 03/29/2022 10:52     Patient Profile     61 y.o. 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 Orders placed; though there is a food tray in the room he has been NPO since midnight  After DCCV should be reasonable for DC      For questions or updates, please contact [facility] Please consult [email or web address] for contact info under Cardiology/STEMI.      Rudean Haskell, MD [place]  [address] [place], [place] [address] [phone]  9:16 AM

## 2022-03-31 NOTE — Anesthesia Procedure Notes (Signed)
Procedure Name: MAC Date/Time: 03/31/2022 1:05 PM  Performed by: Dorann Lodge, CRNAPre-anesthesia Checklist: Patient identified, Emergency Drugs available, Patient being monitored and Suction available Patient Re-evaluated:Patient Re-evaluated prior to induction Oxygen Delivery Method: Nasal cannula Airway Equipment and Method: Bite block Dental Injury: Teeth and Oropharynx as per pre-operative assessment

## 2022-03-31 NOTE — CV Procedure (Addendum)
TEE/CARDIOVERSION NOTE  TRANSESOPHAGEAL ECHOCARDIOGRAM (TEE):  Indictation: Atrial Fibrillation  Consent:   Informed consent was obtained prior to the procedure. The risks, benefits and alternatives for the procedure were discussed and the patient comprehended these risks.  Risks include, but are not limited to, cough, sore throat, vomiting, nausea, somnolence, esophageal and stomach trauma or perforation, bleeding, low blood pressure, aspiration, pneumonia, infection, trauma to the teeth and death.    Time Out: Verified patient identification, verified procedure, site/side was marked, verified correct patient position, special equipment/implants available, medications/allergies/relevent history reviewed, required imaging and test results available. Performed  Procedure:  After a procedural time-out, the patient was given propofol per anesthesia for sedation. See their separate report for details. The patient's heart rate, blood pressure, and oxygen saturation are monitored continuously during the procedure. The oropharynx was anesthetized with topical cetacaine.  The transesophageal probe was inserted in the esophagus and stomach without difficulty and multiple views were obtained. Agitated microbubble saline contrast was not administered.  Complications:    Complications: None Patient did tolerate procedure well.  Findings:  LEFT VENTRICLE: The left ventricular wall thickness is normal.  The left ventricular cavity is mildly dilated in size. Wall motion is globally hypokinetic.  LVEF is 35-40%.  RIGHT VENTRICLE:  The right ventricle is normal in structure and function without any thrombus or masses.    LEFT ATRIUM:  The left atrium is normal in size without any thrombus or masses.  There is not spontaneous echo contrast ("smoke") in the left atrium consistent with a low flow state.  LEFT ATRIAL APPENDAGE:  The left atrial appendage is free of any thrombus or masses. The appendage  has single lobes. Pulse doppler indicates moderate flow in the appendage.  ATRIAL SEPTUM:  The atrial septum appears intact and is free of thrombus and/or masses.  There is no evidence for interatrial shunting by color doppler and saline microbubble.  RIGHT ATRIUM:  The right atrium is normal in size and function without any thrombus or masses.  MITRAL VALVE:  The mitral valve is normal in structure and function with Mild regurgitation.  There were no vegetations or stenosis.  AORTIC VALVE:  The aortic valve is trileaflet, normal in structure and function with  trivial  regurgitation.  There were no vegetations or stenosis  TRICUSPID VALVE:  The tricuspid valve is normal in structure and function with  trivial  regurgitation.  There were no vegetations or stenosis   PULMONIC VALVE:  The pulmonic valve is normal in structure and function with  no  regurgitation.  There were no vegetations or stenosis.   AORTIC ARCH, ASCENDING AND DESCENDING AORTA:  There was no [name] et. Al, 1992) atherosclerosis of the ascending aorta, aortic arch, or proximal descending aorta. The sinus of valsalva was dilated to 43 mm.  12. PULMONARY VEINS: Anomalous pulmonary venous return was not noted.  13. PERICARDIUM: The pericardium appeared normal and non-thickened.  There is no pericardial effusion.  CARDIOVERSION:     Second Time Out: Verified patient identification, verified procedure, site/side was marked, verified correct patient position, special equipment/implants available, medications/allergies/relevent history reviewed, required imaging and test results available.  Performed  Procedure:  Patient placed on cardiac monitor, pulse oximetry, supplemental oxygen as necessary.  Sedation administered per anesthesia Pacer pads placed anterior and posterior chest. Cardioverted 3 time(s).  Cardioverted at 200J biphasic x 3.  Each cardioversion was successful, but ultimately degenerated back into atrial  fibrillation.  Complications:  Complications: None Patient did tolerate  procedure well.  Impression:  No LAA thrombus Negative of PFO by color doppler Dilated sinus of valsalva to 43 mm, trivial AI LVEF 35-40% with global hypokinesis Ultimately successful DCCV after 3 stacked shocks which each resulted in conversion to sinus rhythm, however, ultimately the rhythm reverted back to afib. The patient was subsequently noted to be in a sinus tachycardia in the recovery area.  Recommendations:  Would consider AAD therapy  given higher risk to revert back to afib.  Time Spent Directly with the Patient:  45 minutes   Pixie Casino, MD, [facility], [place] Director of the [facility] Lipid Disorders &  Cardiovascular Risk Reduction Clinic Diplomate of the American Board of Clinical Lipidology Attending Cardiologist  Direct Dial: [phone]  Fax: [phone]  Website:  [email or web address]  Aaron Crawford Aaron Crawford 03/31/2022, 1:35 PM

## 2022-03-31 NOTE — Progress Notes (Signed)
[facility] PROGRESS NOTE   Aaron Crawford JYN:[id number] DOB: 09-08-1961 DOA: 03/29/2022  PCP: Silverio Decamp, MD  Brief History/Interval Summary: 61 y.o. male with medical history significant for untreated hypertension and hyperlipidemia, who initially presented to urgent care with complaints of progressive shortness of breath of 1 week duration.  Worse with exertion and with supine position.  Found to have atrial fibrillation with RVR.  Patient was transported to the emergency department.  Evaluation also raise concern for possible CHF.  Patient was hospitalized for further management.    Consultants: Cardiology  Procedures: Transthoracic echocardiogram     Subjective/Interval History: Patient mentions that his shortness of breath is improved.  Denies any chest pain.  He had an episode of chest pain overnight which lasted just 1 or 2 seconds.  No dizziness or lightheadedness.  His wife is at the bedside     Assessment/Plan:  Atrial fibrillation, new onset, with RVR TSH was noted to be normal.  Patient was started on metoprolol and Eliquis.  Exam shows diminished systolic function.  Cardiology has been consulted and they are following. Patient is currently on apixaban and metoprolol.  Dose was increased yesterday.  Cardiology is contemplating DC cardioversion.  Acute systolic CHF  Symptoms thought to be due to atrial fibrillation and CHF.  Echocardiogram does show diminished EF of 35 to 40%.   CT angiogram done at the time of admission did not show any PE. Patient was given IV diuretics with improvement.  Currently on oral furosemide. He is on beta-blocker.  Looks like ARB and spironolactone has been added. Further management per cardiology.    History of hyperlipidemia Does not take any medications for same at home.  LDL is noted to be 91 with total cholesterol 157, HDL 51, triglycerides 75. Cardiology planning to add statin in the outpatient setting.  Elevated  blood pressure Noted to have elevated blood pressure at the time of admission.  Supposedly has a history of essential hypertension but does not take any medications for same.  Now on beta-blocker, ARB and spironolactone. Blood pressure is reasonably well controlled.  Abnormal LFTs Mildly elevated ALT noted.  Probably related to alcohol consumption.  Hepatitis panel is unremarkable.   DVT Prophylaxis: On Eliquis Code Status: Full code Family Communication: Discussed with patient and his wife Disposition Plan: Hopefully return home when cleared by cardiology.  Status is: Inpatient Remains inpatient appropriate because: New onset atrial fibrillation with RVR and possible CHF      Medications: Scheduled:  apixaban  5 mg Oral BID   furosemide  40 mg Oral Daily   losartan  12.5 mg Oral Daily   metoprolol succinate  50 mg Oral Daily   spironolactone  12.5 mg Oral Daily   Continuous:  sodium chloride Stopped (03/29/22 1401)   QMV:HQIONG chloride, diphenhydrAMINE  Antibiotics: Anti-infectives (From admission, onward)    Start     Dose/Rate Route Frequency Ordered Stop   03/29/22 1315  cefTRIAXone (ROCEPHIN) 1 g in sodium chloride 0.9 % 100 mL IVPB        1 g 200 mL/hr over 30 Minutes Intravenous  Once 03/29/22 1305 03/29/22 1357   03/29/22 1315  azithromycin (ZITHROMAX) 500 mg in sodium chloride 0.9 % 250 mL IVPB        500 mg 250 mL/hr over 60 Minutes Intravenous  Once 03/29/22 1305 03/29/22 1517       Objective:  Vital Signs  Vitals:   03/30/22 1940 03/31/22 0136 03/31/22 0430 03/31/22  0729  BP: (!) 135/96 128/81 124/90 (!) 121/94  Pulse: (!) 106 99 95 71  Resp: '18 18 18 19  '$ Temp: 98.2 F (36.8 C) 98.7 F (37.1 C) 97.8 F (36.6 C) 98 F (36.7 C)  TempSrc: Oral Oral Oral Oral  SpO2: 98% 96% 98% 92%  Weight:   87.7 kg   Height:        Intake/Output Summary (Last 24 hours) at 03/31/2022 1100 Last data filed at 03/31/2022 0500 Gross per 24 hour  Intake 676 ml   Output 3025 ml  Net -2349 ml    Filed Weights   03/29/22 1824 03/30/22 0537 03/31/22 0430  Weight: 88.6 kg 87.7 kg 87.7 kg    General appearance: Awake alert.  In no distress Resp: Normal effort at rest.  Few crackles bilateral bases.  No wheezing or rhonchi. Cardio: S1-S2 is irregularly irregular GI: Abdomen is soft.  Nontender nondistended.  Bowel sounds are present normal.  No masses organomegaly Extremities: No edema.  Full range of motion of lower extremities. Neurologic: Alert and oriented x3.  No focal neurological deficits.    Lab Results:  Data Reviewed: I have personally reviewed following labs and reports of the imaging studies  CBC: Recent Labs  Lab 03/29/22 1037 03/30/22 0307 03/31/22 0216  WBC 8.6 8.0 9.0  NEUTROABS 6.5 5.3  --   HGB 14.1 13.7 14.6  HCT 43.2 42.7 45.6  MCV 85.5 86.6 86.9  PLT 278 248 301     Basic Metabolic Panel: Recent Labs  Lab 03/29/22 1037 03/30/22 0307 03/31/22 0216  NA 138 142 141  K 4.1 4.4 4.9  CL 108 106 104  CO2 '23 27 30  '$ GLUCOSE 125* 102* 116*  BUN '11 11 13  '$ CREATININE 0.76 1.01 1.06  CALCIUM 9.1 9.5 9.4  MG 1.8 2.1  --   PHOS  --  4.1  --      GFR: Estimated Creatinine Clearance: 80.9 mL/min (by C-G formula based on SCr of 1.06 mg/dL).  Liver Function Tests: Recent Labs  Lab 03/29/22 1037 03/30/22 0307  AST 36 26  ALT 75* 62*  ALKPHOS 73 65  BILITOT 1.2 1.0  PROT 7.0 6.3*  ALBUMIN 4.0 3.7     Lipid Profile: Recent Labs    03/30/22 0307  CHOL 157  HDL 51  LDLCALC 91  TRIG 75  CHOLHDL 3.1     Thyroid Function Tests: Recent Labs    03/29/22 1037  TSH 1.120      Recent Results (from the past 240 hour(s))  SARS Coronavirus 2 by RT PCR (hospital order, performed in [facility] hospital lab) *cepheid single result test* Anterior Nasal Swab     Status: None   Collection Time: 03/29/22  1:55 PM   Specimen: Anterior Nasal Swab  Result Value Ref Range Status   SARS Coronavirus 2 by RT  PCR NEGATIVE NEGATIVE Final    Comment: (NOTE) SARS-CoV-2 target nucleic acids are NOT DETECTED.  The SARS-CoV-2 RNA is generally detectable in upper and lower respiratory specimens during the acute phase of infection. The lowest concentration of SARS-CoV-2 viral copies this assay can detect is 250 copies / mL. A negative result does not preclude SARS-CoV-2 infection and should not be used as the sole basis for treatment or other patient management decisions.  A negative result may occur with improper specimen collection / handling, submission of specimen other than nasopharyngeal swab, presence of viral mutation(s) within the areas targeted by this assay, and  inadequate number of viral copies (<250 copies / mL). A negative result must be combined with clinical observations, patient history, and epidemiological information.  Fact Sheet for Patients:   https://www.patel.info/  Fact Sheet for Healthcare Providers: https://hall.com/  This test is not yet approved or  cleared by the [place] FDA and has been authorized for detection and/or diagnosis of SARS-CoV-2 by FDA under an Emergency Use Authorization (EUA).  This EUA will remain in effect (meaning this test can be used) for the duration of the COVID-19 declaration under Section 564(b)(1) of the Act, 21 U.S.C. section 360bbb-3(b)(1), unless the authorization is terminated or revoked sooner.  Performed at [facility], [address] [place], [place] [address]       Radiology Studies: ECHOCARDIOGRAM COMPLETE  Result Date: 03/30/2022    ECHOCARDIOGRAM REPORT   Patient Name:   Aaron Crawford Date of Exam: 03/30/2022 Medical Rec #:  [id number]   Height:       71.0 in Accession #:    [id number]  Weight:       193.4 lb Date of Birth:  21-Aug-1961  BSA:          2.079 m Patient Age:    46 years    BP:           97/61 mmHg Patient Gender: M           HR:           95 bpm. Exam  Location:  Inpatient Procedure: 2D Echo, Cardiac Doppler and Color Doppler Indications:    Afib  History:        Patient has no prior history of Echocardiogram examinations.  Sonographer:    Jyl Heinz Referring Phys: [id number] [place]  1. Left ventricular ejection fraction, by estimation, is 35 to 40%. The left ventricle has moderately decreased function. The left ventricle demonstrates global hypokinesis. Left ventricular diastolic parameters are indeterminate.  2. Right ventricular systolic function is normal. The right ventricular size is normal. There is normal pulmonary artery systolic pressure. The estimated right ventricular systolic pressure is 97.6 mmHg.  3. The mitral valve is normal in structure. Mild mitral valve regurgitation. No evidence of mitral stenosis.  4. The aortic valve is normal in structure. Aortic valve regurgitation is not visualized. No aortic stenosis is present.  5. The inferior vena cava is normal in size with greater than 50% respiratory variability, suggesting right atrial pressure of 3 mmHg. FINDINGS  Left Ventricle: Left ventricular ejection fraction, by estimation, is 35 to 40%. The left ventricle has moderately decreased function. The left ventricle demonstrates global hypokinesis. The left ventricular internal cavity size was normal in size. There is no left ventricular hypertrophy. Left ventricular diastolic parameters are indeterminate. Right Ventricle: The right ventricular size is normal. No increase in right ventricular wall thickness. Right ventricular systolic function is normal. There is normal pulmonary artery systolic pressure. The tricuspid regurgitant velocity is 2.37 m/s, and  with an assumed right atrial pressure of 3 mmHg, the estimated right ventricular systolic pressure is 73.4 mmHg. Left Atrium: Left atrial size was normal in size. Right Atrium: Right atrial size was normal in size. Pericardium: There is no evidence of pericardial effusion.  Mitral Valve: The mitral valve is normal in structure. Mild mitral annular calcification. Mild mitral valve regurgitation. No evidence of mitral valve stenosis. Tricuspid Valve: The tricuspid valve is normal in structure. Tricuspid valve regurgitation is trivial. No evidence of tricuspid stenosis. Aortic Valve:  The aortic valve is normal in structure. Aortic valve regurgitation is not visualized. No aortic stenosis is present. Aortic valve peak gradient measures 2.6 mmHg. Pulmonic Valve: The pulmonic valve was normal in structure. Pulmonic valve regurgitation is mild. No evidence of pulmonic stenosis. Aorta: The aortic root is normal in size and structure. Venous: The inferior vena cava is normal in size with greater than 50% respiratory variability, suggesting right atrial pressure of 3 mmHg. IAS/Shunts: No atrial level shunt detected by color flow Doppler.  LEFT VENTRICLE PLAX 2D LVIDd:         5.10 cm      Diastology LVIDs:         4.10 cm      LV e' medial:    6.85 cm/s LV PW:         1.10 cm      LV E/e' medial:  11.9 LV IVS:        0.90 cm      LV e' lateral:   8.05 cm/s LVOT diam:     2.00 cm      LV E/e' lateral: 10.2 LV SV:         36 LV SV Index:   18 LVOT Area:     3.14 cm  LV Volumes (MOD) LV vol d, MOD A2C: 129.0 ml LV vol d, MOD A4C: 107.0 ml LV vol s, MOD A2C: 75.4 ml LV vol s, MOD A4C: 62.0 ml LV SV MOD A2C:     53.6 ml LV SV MOD A4C:     107.0 ml LV SV MOD BP:      51.9 ml RIGHT VENTRICLE            IVC RV Basal diam:  3.30 cm    IVC diam: 2.30 cm RV Mid diam:    2.70 cm RV S prime:     7.18 cm/s TAPSE (M-mode): 1.5 cm LEFT ATRIUM             Index        RIGHT ATRIUM           Index LA diam:        4.60 cm 2.21 cm/m   RA Area:     16.70 cm LA Vol (A2C):   57.4 ml 27.61 ml/m  RA Volume:   41.00 ml  19.72 ml/m LA Vol (A4C):   52.9 ml 25.45 ml/m LA Biplane Vol: 54.9 ml 26.41 ml/m  AORTIC VALVE AV Area (Vmax): 2.64 cm AV Vmax:        80.10 cm/s AV Peak Grad:   2.6 mmHg LVOT Vmax:      67.20  cm/s LVOT Vmean:     49.900 cm/s LVOT VTI:       0.116 m  AORTA Ao Root diam: 3.90 cm Ao Asc diam:  3.30 cm MITRAL VALVE               TRICUSPID VALVE MV Area (PHT): 6.48 cm    TR Peak grad:   22.5 mmHg MV Decel Time: 117 msec    TR Vmax:        237.00 cm/s MR Peak grad: 72.9 mmHg MR Mean grad: 61.0 mmHg    SHUNTS MR Vmax:      427.00 cm/s  Systemic VTI:  0.12 m MR Vmean:     379.0 cm/s   Systemic Diam: 2.00 cm MV E velocity: 81.80 cm/s Candee Furbish MD Electronically signed by Candee Furbish MD Signature  Date/Time: 03/30/2022/3:48:25 PM    Final    CT Angio Chest PE W and/or Wo Contrast  Result Date: 03/29/2022 CLINICAL DATA:  Shortness of breath. Sent from urgent care. Atrial fibrillation. EXAM: CT ANGIOGRAPHY CHEST WITH CONTRAST TECHNIQUE: Multidetector CT imaging of the chest was performed using the standard protocol during bolus administration of intravenous contrast. Multiplanar CT image reconstructions and MIPs were obtained to evaluate the vascular anatomy. RADIATION DOSE REDUCTION: This exam was performed according to the departmental dose-optimization program which includes automated exposure control, adjustment of the mA and/or kV according to patient size and/or use of iterative reconstruction technique. CONTRAST:  87m OMNIPAQUE IOHEXOL 350 MG/ML SOLN COMPARISON:  None Available. FINDINGS: Cardiovascular: Satisfactory opacification of the pulmonary arteries to the segmental level. No evidence of pulmonary embolism. Normal heart size. No pericardial effusion. Mediastinum/Nodes: Multiple subcentimeter vascular space and right paratracheal lymph nodes, likely reactive. Lungs/Pleura: Right basilar atelectasis or infiltrate. Small bilateral pleural effusions, right greater than the left. There are reticulonodular opacities suggesting infectious/inflammatory process. Calcified granuloma in the anterior aspect of the right lower lobe. Upper Abdomen: No acute abnormality. Musculoskeletal: No chest wall  abnormality. No acute or significant osseous findings. Review of the MIP images confirms the above findings. IMPRESSION: 1.  No evidence of pulmonary embolism. 2. Scattered reticulonodular opacities and right basilar atelectasis and small bilateral pleural effusions concerning for infectious/inflammatory process including pneumonia. Follow-up examination to resolution is recommended. Electronically Signed   By: IKeane PoliceD.O.   On: 03/29/2022 13:00       LOS: 2 days   [facility] Pager on [email or web address]  03/31/2022, 11:00 AM

## 2022-03-31 NOTE — Discharge Summary (Signed)
[facility]  Physician Discharge Summary   Patient ID: Aaron Crawford MRN: [id number] DOB/AGE: Aug 11, 1961 61 y.o.  Admit date: 03/29/2022 Discharge date: 03/31/2022     PCP: Silverio Decamp, MD  DISCHARGE DIAGNOSES:  Principal Problem:   Atrial fibrillation with rapid ventricular response ([place]) Active Problems:   Acute systolic CHF (congestive heart failure) ([place])   RECOMMENDATIONS FOR OUTPATIENT FOLLOW UP: Cardiology to arrange outpatient follow-up   Home Health: None Equipment/Devices: None  CODE STATUS: Full code  DISCHARGE CONDITION: fair  Diet recommendation: Heart healthy  INITIAL HISTORY: 61 y.o. male with medical history significant for untreated hypertension and hyperlipidemia, who initially presented to urgent care with complaints of progressive shortness of breath of 1 week duration.  Worse with exertion and with supine position.  Found to have atrial fibrillation with RVR.  Patient was transported to the emergency department.  Evaluation also raise concern for possible CHF.  Patient was hospitalized for further management.     Consultants: Cardiology   Procedures: Transthoracic echocardiogram     HOSPITAL COURSE:     Atrial fibrillation, new onset, with RVR TSH was noted to be normal.  Patient was started on metoprolol and Eliquis.  Echocardiogram shows diminished systolic function.  Patient was seen by cardiology.  Patient was started on apixaban and metoprolol.  Dose was adjusted.  DC cardioversion was recommended.  Underwent TEE and then underwent 3 attempts at cardioversion however patient always reverted back to atrial fibrillation.  Plan is to reattempt DC cardioversion in about 2 weeks time.  Cardiology recommended that patient be discharged tomorrow, but patient was very keen on going home today itself.    Acute systolic CHF  Symptoms thought to be due to atrial fibrillation and CHF.  Echocardiogram does show diminished EF of 35 to 40%.    CT angiogram done at the time of admission did not show any PE. Patient was given IV diuretics with improvement.  Currently on oral furosemide. He is on beta-blocker.  Looks like ARB and spironolactone has been added. Cardiology to consider further treatments in the outpatient setting.   History of hyperlipidemia Does not take any medications for same at home.  LDL is noted to be 91 with total cholesterol 157, HDL 51, triglycerides 75. Cardiology planning to add statin in the outpatient setting.   Elevated blood pressure Noted to have elevated blood pressure at the time of admission.  Supposedly has a history of essential hypertension but does not take any medications for same.  Now on beta-blocker, ARB and spironolactone. Blood pressure is reasonably well controlled.   Abnormal LFTs Mildly elevated ALT noted.  Probably related to alcohol consumption.  Hepatitis panel is unremarkable.  He was advised to stop drinking alcohol.     Patient was advised by cardiology to stay another night.  However he was adamant that he wanted to go home.  Subsequently he was discharged home.    PERTINENT LABS:  The results of significant diagnostics from this hospitalization (including imaging, microbiology, ancillary and laboratory) are listed below for reference.    Microbiology: Recent Results (from the past 240 hour(s))  SARS Coronavirus 2 by RT PCR (hospital order, performed in [facility] hospital lab) *cepheid single result test* Anterior Nasal Swab     Status: None   Collection Time: 03/29/22  1:55 PM   Specimen: Anterior Nasal Swab  Result Value Ref Range Status   SARS Coronavirus 2 by RT PCR NEGATIVE NEGATIVE Final    Comment: (NOTE) SARS-CoV-2  target nucleic acids are NOT DETECTED.  The SARS-CoV-2 RNA is generally detectable in upper and lower respiratory specimens during the acute phase of infection. The lowest concentration of SARS-CoV-2 viral copies this assay can detect is  250 copies / mL. A negative result does not preclude SARS-CoV-2 infection and should not be used as the sole basis for treatment or other patient management decisions.  A negative result may occur with improper specimen collection / handling, submission of specimen other than nasopharyngeal swab, presence of viral mutation(s) within the areas targeted by this assay, and inadequate number of viral copies (<250 copies / mL). A negative result must be combined with clinical observations, patient history, and epidemiological information.  Fact Sheet for Patients:   https://www.patel.info/  Fact Sheet for Healthcare Providers: https://hall.com/  This test is not yet approved or  cleared by the [place] FDA and has been authorized for detection and/or diagnosis of SARS-CoV-2 by FDA under an Emergency Use Authorization (EUA).  This EUA will remain in effect (meaning this test can be used) for the duration of the COVID-19 declaration under Section 564(b)(1) of the Act, 21 U.S.C. section 360bbb-3(b)(1), unless the authorization is terminated or revoked sooner.  Performed at [facility], [address] [place], [place] [address]      Labs:   Basic Metabolic Panel: Recent Labs  Lab 03/29/22 1037 03/30/22 0307 03/31/22 0216  NA 138 142 141  K 4.1 4.4 4.9  CL 108 106 104  CO2 '23 27 30  '$ GLUCOSE 125* 102* 116*  BUN '11 11 13  '$ CREATININE 0.76 1.01 1.06  CALCIUM 9.1 9.5 9.4  MG 1.8 2.1  --   PHOS  --  4.1  --    Liver Function Tests: Recent Labs  Lab 03/29/22 1037 03/30/22 0307  AST 36 26  ALT 75* 62*  ALKPHOS 73 65  BILITOT 1.2 1.0  PROT 7.0 6.3*  ALBUMIN 4.0 3.7    CBC: Recent Labs  Lab 03/29/22 1037 03/30/22 0307 03/31/22 0216  WBC 8.6 8.0 9.0  NEUTROABS 6.5 5.3  --   HGB 14.1 13.7 14.6  HCT 43.2 42.7 45.6  MCV 85.5 86.6 86.9  PLT 278 248 301    BNP: BNP (last 3 results) Recent Labs     03/29/22 1041  BNP 439.0*    IMAGING STUDIES ECHO TEE  Result Date: 03/31/2022    TRANSESOPHOGEAL ECHO REPORT   Patient Name:   JAICOB DIA Date of Exam: 03/31/2022 Medical Rec #:  [id number]   Height:       71.0 in Accession #:    [id number]  Weight:       193.3 lb Date of Birth:  01-21-1961  BSA:          2.078 m Patient Age:    51 years    BP:           137/94 mmHg Patient Gender: M           HR:           98 bpm. Exam Location:  Inpatient Procedure: Transesophageal Echo, Cardiac Doppler and Color Doppler Indications:    Atrial fibrillation  History:        Patient has prior history of Echocardiogram examinations, most                 recent 03/30/2022. Risk Factors:Dyslipidemia.  Sonographer:    [name] Referring Phys: [id number] [place]: After discussion of  the risks and benefits of a TEE, an informed consent was obtained from the patient. TEE procedure time was 6 minutes. The transesophogeal probe was passed without difficulty through the esophogus of the patient. Imaged were  obtained with the patient in a supine position. Sedation performed by different physician. The patient was monitored while under deep sedation. Anesthestetic sedation was provided intravenously by Anesthesiology: 348.'88mg'$  of Propofol, '100mg'$  of Lidocaine. Image quality was good. The patient developed no complications during the procedure. 1. Cardioverted 3 time(s). 2. Cardioverted at 200J biphasic x 3. Each cardioversion was successful, but ultimately degenerated back into atrial fibrillation. IMPRESSIONS  1. Left ventricular ejection fraction, by estimation, is 35 to 40%. The left ventricle has moderately decreased function. The left ventricle demonstrates global hypokinesis. The left ventricular internal cavity size was mildly dilated.  2. Right ventricular systolic function is low normal. The right ventricular size is normal.  3. No left atrial/left atrial appendage thrombus was detected.  4. The mitral  valve is grossly normal. Mild mitral valve regurgitation.  5. The aortic valve is tricuspid. Aortic valve regurgitation is trivial.  6. Aortic dilatation noted. 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  7. 1. Cardioverted 3 time(s).     2. Cardioverted at 200J biphasic x 3. Each cardioversion was successful, but ultimately degenerated back into atrial fibrillation. Conclusion(s)/Recommendation(s): No LA/LAA thrombus identified. Unsuccessful cardioversion performed without restoration of normal sinus rhythm. FINDINGS  Left Ventricle: Left ventricular ejection fraction, by estimation, is 35 to 40%. The left ventricle has moderately decreased function. The left ventricle demonstrates global hypokinesis. The left ventricular internal cavity size was mildly dilated. There is no left ventricular hypertrophy. Right Ventricle: The right ventricular size is normal. No increase in right ventricular wall thickness. Right ventricular systolic function is low normal. Left Atrium: Left atrial size was normal in size. No left atrial/left atrial appendage thrombus was detected. Right Atrium: Right atrial size was normal in size. Pericardium: There is no evidence of pericardial effusion. Mitral Valve: The mitral valve is grossly normal. Mild mitral valve regurgitation, with centrally-directed jet. Tricuspid Valve: The tricuspid valve is grossly normal. Tricuspid valve regurgitation is trivial. Aortic Valve: The aortic valve is tricuspid. Aortic valve regurgitation is trivial. Pulmonic Valve: The pulmonic valve was normal in structure. Pulmonic valve regurgitation is not visualized. Aorta: Aortic dilatation noted. 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 IAS/Shunts: No atrial level shunt detected by color flow Doppler.   AORTA Ao Root diam: 4.30 cm Ao Asc diam:  3.40 cm Lyman Bishop MD Electronically signed by Lyman Bishop MD Signature Date/Time: 03/31/2022/3:16:20 PM    Final     ECHOCARDIOGRAM COMPLETE  Result Date: 03/30/2022    ECHOCARDIOGRAM REPORT   Patient Name:   REEGAN BOUFFARD Date of Exam: 03/30/2022 Medical Rec #:  [id number]   Height:       71.0 in Accession #:    [id number]  Weight:       193.4 lb Date of Birth:  01-Dec-1960  BSA:          2.079 m Patient Age:    23 years    BP:           97/61 mmHg Patient Gender: M           HR:           95 bpm. Exam Location:  Inpatient Procedure: 2D Echo, Cardiac Doppler and Color Doppler Indications:    Afib  History:  Patient has no prior history of Echocardiogram examinations.  Sonographer:    Jyl Heinz Referring Phys: [id number] [place]  1. Left ventricular ejection fraction, by estimation, is 35 to 40%. The left ventricle has moderately decreased function. The left ventricle demonstrates global hypokinesis. Left ventricular diastolic parameters are indeterminate.  2. Right ventricular systolic function is normal. The right ventricular size is normal. There is normal pulmonary artery systolic pressure. The estimated right ventricular systolic pressure is 37.1 mmHg.  3. The mitral valve is normal in structure. Mild mitral valve regurgitation. No evidence of mitral stenosis.  4. The aortic valve is normal in structure. Aortic valve regurgitation is not visualized. No aortic stenosis is present.  5. The inferior vena cava is normal in size with greater than 50% respiratory variability, suggesting right atrial pressure of 3 mmHg. FINDINGS  Left Ventricle: Left ventricular ejection fraction, by estimation, is 35 to 40%. The left ventricle has moderately decreased function. The left ventricle demonstrates global hypokinesis. The left ventricular internal cavity size was normal in size. There is no left ventricular hypertrophy. Left ventricular diastolic parameters are indeterminate. Right Ventricle: The right ventricular size is normal. No increase in right ventricular wall thickness. Right ventricular systolic function  is normal. There is normal pulmonary artery systolic pressure. The tricuspid regurgitant velocity is 2.37 m/s, and  with an assumed right atrial pressure of 3 mmHg, the estimated right ventricular systolic pressure is 69.6 mmHg. Left Atrium: Left atrial size was normal in size. Right Atrium: Right atrial size was normal in size. Pericardium: There is no evidence of pericardial effusion. Mitral Valve: The mitral valve is normal in structure. Mild mitral annular calcification. Mild mitral valve regurgitation. No evidence of mitral valve stenosis. Tricuspid Valve: The tricuspid valve is normal in structure. Tricuspid valve regurgitation is trivial. No evidence of tricuspid stenosis. Aortic Valve: The aortic valve is normal in structure. Aortic valve regurgitation is not visualized. No aortic stenosis is present. Aortic valve peak gradient measures 2.6 mmHg. Pulmonic Valve: The pulmonic valve was normal in structure. Pulmonic valve regurgitation is mild. No evidence of pulmonic stenosis. Aorta: The aortic root is normal in size and structure. Venous: The inferior vena cava is normal in size with greater than 50% respiratory variability, suggesting right atrial pressure of 3 mmHg. IAS/Shunts: No atrial level shunt detected by color flow Doppler.  LEFT VENTRICLE PLAX 2D LVIDd:         5.10 cm      Diastology LVIDs:         4.10 cm      LV e' medial:    6.85 cm/s LV PW:         1.10 cm      LV E/e' medial:  11.9 LV IVS:        0.90 cm      LV e' lateral:   8.05 cm/s LVOT diam:     2.00 cm      LV E/e' lateral: 10.2 LV SV:         36 LV SV Index:   18 LVOT Area:     3.14 cm  LV Volumes (MOD) LV vol d, MOD A2C: 129.0 ml LV vol d, MOD A4C: 107.0 ml LV vol s, MOD A2C: 75.4 ml LV vol s, MOD A4C: 62.0 ml LV SV MOD A2C:     53.6 ml LV SV MOD A4C:     107.0 ml LV SV MOD BP:      51.9  ml RIGHT VENTRICLE            IVC RV Basal diam:  3.30 cm    IVC diam: 2.30 cm RV Mid diam:    2.70 cm RV S prime:     7.18 cm/s TAPSE (M-mode): 1.5  cm LEFT ATRIUM             Index        RIGHT ATRIUM           Index LA diam:        4.60 cm 2.21 cm/m   RA Area:     16.70 cm LA Vol (A2C):   57.4 ml 27.61 ml/m  RA Volume:   41.00 ml  19.72 ml/m LA Vol (A4C):   52.9 ml 25.45 ml/m LA Biplane Vol: 54.9 ml 26.41 ml/m  AORTIC VALVE AV Area (Vmax): 2.64 cm AV Vmax:        80.10 cm/s AV Peak Grad:   2.6 mmHg LVOT Vmax:      67.20 cm/s LVOT Vmean:     49.900 cm/s LVOT VTI:       0.116 m  AORTA Ao Root diam: 3.90 cm Ao Asc diam:  3.30 cm MITRAL VALVE               TRICUSPID VALVE MV Area (PHT): 6.48 cm    TR Peak grad:   22.5 mmHg MV Decel Time: 117 msec    TR Vmax:        237.00 cm/s MR Peak grad: 72.9 mmHg MR Mean grad: 61.0 mmHg    SHUNTS MR Vmax:      427.00 cm/s  Systemic VTI:  0.12 m MR Vmean:     379.0 cm/s   Systemic Diam: 2.00 cm MV E velocity: 81.80 cm/s Candee Furbish MD Electronically signed by Candee Furbish MD Signature Date/Time: 03/30/2022/3:48:25 PM    Final    CT Angio Chest PE W and/or Wo Contrast  Result Date: 03/29/2022 CLINICAL DATA:  Shortness of breath. Sent from urgent care. Atrial fibrillation. EXAM: CT ANGIOGRAPHY CHEST WITH CONTRAST TECHNIQUE: Multidetector CT imaging of the chest was performed using the standard protocol during bolus administration of intravenous contrast. Multiplanar CT image reconstructions and MIPs were obtained to evaluate the vascular anatomy. RADIATION DOSE REDUCTION: This exam was performed according to the departmental dose-optimization program which includes automated exposure control, adjustment of the mA and/or kV according to patient size and/or use of iterative reconstruction technique. CONTRAST:  77m OMNIPAQUE IOHEXOL 350 MG/ML SOLN COMPARISON:  None Available. FINDINGS: Cardiovascular: Satisfactory opacification of the pulmonary arteries to the segmental level. No evidence of pulmonary embolism. Normal heart size. No pericardial effusion. Mediastinum/Nodes: Multiple subcentimeter vascular space and right  paratracheal lymph nodes, likely reactive. Lungs/Pleura: Right basilar atelectasis or infiltrate. Small bilateral pleural effusions, right greater than the left. There are reticulonodular opacities suggesting infectious/inflammatory process. Calcified granuloma in the anterior aspect of the right lower lobe. Upper Abdomen: No acute abnormality. Musculoskeletal: No chest wall abnormality. No acute or significant osseous findings. Review of the MIP images confirms the above findings. IMPRESSION: 1.  No evidence of pulmonary embolism. 2. Scattered reticulonodular opacities and right basilar atelectasis and small bilateral pleural effusions concerning for infectious/inflammatory process including pneumonia. Follow-up examination to resolution is recommended. Electronically Signed   By: IKeane PoliceD.O.   On: 03/29/2022 13:00   DG Chest Portable 1 View  Result Date: 03/29/2022 CLINICAL DATA:  Shortness of breath. EXAM: PORTABLE CHEST 1 VIEW  COMPARISON:  None Available. FINDINGS: 1039 hours. The cardio pericardial silhouette is enlarged. There is pulmonary vascular congestion without overt pulmonary edema. Subtle bibasilar atelectasis or infiltrate noted, left greater than right. Telemetry leads overlie the chest. IMPRESSION: 1. Pulmonary vascular congestion without overt pulmonary edema. 2. Subtle bibasilar atelectasis or infiltrate, left greater than right. Electronically Signed   By: Misty Stanley M.D.   On: 03/29/2022 10:52    DISCHARGE EXAMINATION: Vitals:   03/31/22 1400 03/31/22 1401 03/31/22 1525 03/31/22 1527  BP:  (!) 119/93 (!) 132/94 (!) 132/94  Pulse: (!) 109 (!) 109 (!) 115 (!) 114  Resp: (!) 21 (!) 24  19  Temp:    97.9 F (36.6 C)  TempSrc:    Oral  SpO2: 93% 92% 97% 97%  Weight:      Height:       See progress note from earlier today.   DISPOSITION: Home  Discharge Instructions     (HEART FAILURE PATIENTS) Call MD:  Anytime you have any of the following symptoms: 1) 3 pound  weight gain in 24 hours or 5 pounds in 1 week 2) shortness of breath, with or without a dry hacking cough 3) swelling in the hands, feet or stomach 4) if you have to sleep on extra pillows at night in order to breathe.   Complete by: As directed    Amb referral to AFIB Clinic   Complete by: As directed    Avoid straining   Complete by: As directed    Call MD for:  difficulty breathing, headache or visual disturbances   Complete by: As directed    Call MD for:  extreme fatigue   Complete by: As directed    Call MD for:  persistant dizziness or light-headedness   Complete by: As directed    Call MD for:  persistant nausea and vomiting   Complete by: As directed    Call MD for:  severe uncontrolled pain   Complete by: As directed    Call MD for:  temperature >100.4   Complete by: As directed    Diet - low sodium heart healthy   Complete by: As directed    Discharge instructions   Complete by: As directed    Take your medications as prescribed.  Cardiology will arrange outpatient follow-up.  You were cared for by a hospitalist during your hospital stay. If you have any questions about your discharge medications or the care you received while you were in the hospital after you are discharged, you can call the unit and asked to speak with the hospitalist on call if the hospitalist that took care of you is not available. Once you are discharged, your primary care physician will handle any further medical issues. Please note that NO REFILLS for any discharge medications will be authorized once you are discharged, as it is imperative that you return to your primary care physician (or establish a relationship with a primary care physician if you do not have one) for your aftercare needs so that they can reassess your need for medications and monitor your lab values. If you do not have a primary care physician, you can call [phone] for a physician referral.   Heart Failure patients record your daily  weight using the same scale at the same time of day   Complete by: As directed    Increase activity slowly   Complete by: As directed    STOP any activity that causes chest pain, shortness  of breath, dizziness, sweating, or exessive weakness   Complete by: As directed           Allergies as of 03/31/2022       Reactions   Ambien [zolpidem] Other (See Comments)   Panic Attack   Penicillins Other (See Comments)   "i've always been told since I was a kid, that I am allergic, but I don't know what type of reaction."   Percocet [oxycodone-acetaminophen] Anxiety        Medication List     STOP taking these medications    doxycycline 100 MG capsule Commonly known as: VIBRAMYCIN   GAMMA AMINOBUTYRIC ACID PO   ibuprofen 200 MG tablet Commonly known as: ADVIL       TAKE these medications    amiodarone 200 MG tablet Commonly known as: PACERONE Take 2 tablets ('400mg'$ ) twice daily for 2 weeks and then take 1 tablet ('200mg'$ ) once daily. Start taking on: April 14, 2022   Eliquis 5 MG Tabs tablet Generic drug: apixaban Take 1 tablet (5 mg total) by mouth 2 (two) times daily.   furosemide 40 MG tablet Commonly known as: LASIX Take 1 tablet (40 mg total) by mouth daily.   losartan 25 MG tablet Commonly known as: COZAAR Take 0.5 tablets (12.5 mg total) by mouth daily.   metoprolol succinate 50 MG 24 hr tablet Commonly known as: TOPROL-XL Take 1 tablet (50 mg total) by mouth daily. Take with or immediately following a meal.   spironolactone 25 MG tablet Commonly known as: ALDACTONE Take 0.5 tablets (12.5 mg total) by mouth daily.          Follow-up Information     [facility] Follow up on 04/05/2022.   Specialty: Cardiology Why: 3pm on 04/05/22 Contact information: [address] [id number] [place]        Werner Lean, MD Follow up on 05/02/2022.   Specialty:  Cardiology Why: 9:40AM on 05/02/22 Contact information: [address] [place] [address] [phone]         Silverio Decamp, MD. Go on 04/11/2022.   Specialties: Family Medicine, Sports Medicine, Radiology Why: '@1'$ :45pm please arrive '@1'$ :30pm Contact information: [place] [id number] [phone]                 TOTAL DISCHARGE TIME: 35 minutes  [place]  [facility] Pager on [email or web address]  04/01/2022, 1:28 PM

## 2022-03-31 NOTE — Interval H&P Note (Signed)
History and Physical Interval Note:  03/31/2022 12:58 PM  Aaron Crawford  has presented today for surgery, with the diagnosis of AFIB.  The various methods of treatment have been discussed with the patient and family. After consideration of risks, benefits and other options for treatment, the patient has consented to  Procedure(s): TRANSESOPHAGEAL ECHOCARDIOGRAM (TEE) (N/A) CARDIOVERSION (N/A) as a surgical intervention.  The patient's history has been reviewed, patient examined, no change in status, stable for surgery.  I have reviewed the patient's chart and labs.  Questions were answered to the patient's satisfaction.     Pixie [place]

## 2022-03-31 NOTE — Progress Notes (Signed)
  Echocardiogram Echocardiogram Transesophageal has been performed.  [name] 03/31/2022, 1:54 PM

## 2022-03-31 NOTE — TOC Transition Note (Signed)
Transition of Care [facility]) - CM/SW Discharge Note   Patient Details  Name: Aaron Crawford MRN: [id number] Date of Birth: July 19, 1961  Transition of Care [facility]) CM/SW Contact:  Zenon Mayo, RN Phone Number: 03/31/2022, 2:15 PM   Clinical Narrative:    Patient will dc later this pm per MD, he will send the eliquis up to [facility] to be filled for the first 30 days and this NCM gave the 10.00 copay card to Staff RN Velia to give to patient when he comes back from procedure.           Patient Goals and CMS Choice        Discharge Placement                       Discharge Plan and Services                                     Social Determinants of Health (SDOH) Interventions     Readmission Risk Interventions     No data to display

## 2022-04-02 ENCOUNTER — Encounter (HOSPITAL_COMMUNITY): Payer: Self-pay | Admitting: Internal Medicine

## 2022-04-03 ENCOUNTER — Telehealth: Payer: Self-pay | Admitting: General Practice

## 2022-04-03 NOTE — Telephone Encounter (Signed)
Transition Care Management Unsuccessful Follow-up Telephone Call  Date of discharge and from where:  03/31/22 from [facility]  Attempts:  1st Attempt  Reason for unsuccessful TCM follow-up call:  Left voice message

## 2022-04-04 NOTE — Telephone Encounter (Signed)
Transition Care Management Unsuccessful Follow-up Telephone Call  Date of discharge and from where:  03/31/22 from [facility]  Attempts:  2nd Attempt  Reason for unsuccessful TCM follow-up call:  Left voice message

## 2022-04-05 ENCOUNTER — Other Ambulatory Visit (HOSPITAL_COMMUNITY): Payer: Self-pay

## 2022-04-05 ENCOUNTER — Encounter (HOSPITAL_COMMUNITY): Payer: Self-pay

## 2022-04-05 ENCOUNTER — Ambulatory Visit (HOSPITAL_COMMUNITY)
Admit: 2022-04-05 | Discharge: 2022-04-05 | Disposition: A | Discharge status: Home / Self Care | Payer: 59 | Attending: Cardiology | Admitting: Cardiology

## 2022-04-05 VITALS — BP 120/70 | HR 77 | Ht 71.0 in | Wt 190.0 lb

## 2022-04-05 DIAGNOSIS — I110 Hypertensive heart disease with heart failure: Secondary | ICD-10-CM | POA: Diagnosis present

## 2022-04-05 DIAGNOSIS — Z7901 Long term (current) use of anticoagulants: Secondary | ICD-10-CM | POA: Diagnosis not present

## 2022-04-05 DIAGNOSIS — Z79899 Other long term (current) drug therapy: Secondary | ICD-10-CM | POA: Insufficient documentation

## 2022-04-05 DIAGNOSIS — I480 Paroxysmal atrial fibrillation: Secondary | ICD-10-CM

## 2022-04-05 DIAGNOSIS — E785 Hyperlipidemia, unspecified: Secondary | ICD-10-CM | POA: Diagnosis not present

## 2022-04-05 DIAGNOSIS — R0602 Shortness of breath: Secondary | ICD-10-CM | POA: Insufficient documentation

## 2022-04-05 DIAGNOSIS — R0989 Other specified symptoms and signs involving the circulatory and respiratory systems: Secondary | ICD-10-CM | POA: Diagnosis not present

## 2022-04-05 DIAGNOSIS — I4891 Unspecified atrial fibrillation: Secondary | ICD-10-CM | POA: Diagnosis not present

## 2022-04-05 DIAGNOSIS — I5022 Chronic systolic (congestive) heart failure: Secondary | ICD-10-CM | POA: Diagnosis present

## 2022-04-05 MED ORDER — LOSARTAN POTASSIUM 25 MG PO TABS: 25.0000 mg | ORAL_TABLET | Freq: Every day | ORAL | 2 refills | Status: DC

## 2022-04-05 NOTE — Progress Notes (Signed)
HEART & VASCULAR TRANSITION OF CARE CONSULT NOTE     Referring Physician: Dr. Gasper Sells Primary Care: Silverio Decamp, MD  Primary Cardiologist: Dr. Gasper Sells   HPI: Referred to clinic by Dr. Gasper Sells for heart failure consultation.   61 y/o male w/ PMH of untreated HTN, HLD and ETOH use. Presented to [facility] 03/29/22 w/ complaints of new SOB x 1 week. Found to be in afib w/ RVR and acute CHF. CXR w/ congestion. BNP 439. HS trop 6>>6. SCr 0.76. K 4.1, Mg 1.8.  Initially placed on Cardizem gtt + Eliquis and diuresed w/ IV Lasix. Mg supp.   2D echo showed moderately reduced LVEF, 35-40%, RV normal. Cardizem discontinued. Switched to metoprolol.   On 7/7, underwent TEE/DCCV which demonstrated no thrombus. EF 35-40% with global hypokinesis. Ultimately successful DCCV after 3 stacked shocks which each resulted in conversion to sinus rhythm, however, ultimately the rhythm reverted back to afib. The patient was subsequently noted to be in a sinus tachycardia in the recovery area and was started on AAD therapy w/ PO amiodarone.   Discharged on 7/7 on GDMT w/ Losartan, spironolactone and Toprol XL + Lasix 40 mg daily. Referred to [facility] clinic. D/c wt 192 lb.   Presents today for f/u. Here w/ wife. Reports doing well. Feels back to baseline w/ breathing. EKG shows NSR. Denies symptoms of breakthrough AF. Compliant w/ meds. No abnormal bleeding w/ eliquis. Denies CP. BP stable 120/70. No orthostatic symptoms but worried about his BP going too low. Wt stable and down 2 lb since d/c. ReDs clip 28%.   Cardiac Testing   2D Echo 03/29/22 1. Left ventricular ejection fraction, by estimation, is 35 to 40%. The  left ventricle has moderately decreased function. The left ventricle  demonstrates global hypokinesis. Left ventricular diastolic parameters are  indeterminate.   2. Right ventricular systolic function is normal. The right ventricular  size is normal. There is normal pulmonary  artery systolic pressure. The  estimated right ventricular systolic pressure is 38.4 mmHg.   3. The mitral valve is normal in structure. Mild mitral valve  regurgitation. No evidence of mitral stenosis.   4. The aortic valve is normal in structure. Aortic valve regurgitation is  not visualized. No aortic stenosis is present.   5. The inferior vena cava is normal in size with greater than 50%  respiratory variability, suggesting right atrial pressure of 3 mmHg.   TEE/ DCCV 03/31/22  Impression:   No LAA thrombus Negative of PFO by color doppler Dilated sinus of valsalva to 43 mm, trivial AI LVEF 35-40% with global hypokinesis Ultimately successful DCCV after 3 stacked shocks which each resulted in conversion to sinus rhythm, however, ultimately the rhythm reverted back to afib. The patient was subsequently noted to be in a sinus tachycardia in the recovery area.   Recommendations:   Would consider AAD therapy  given higher risk to revert back to afib.   Review of Systems: [y] = yes, '[ ]'$  = no   General: Weight gain '[ ]'$ ; Weight loss '[ ]'$ ; Anorexia '[ ]'$ ; Fatigue '[ ]'$ ; Fever '[ ]'$ ; Chills '[ ]'$ ; Weakness '[ ]'$   Cardiac: Chest pain/pressure '[ ]'$ ; Resting SOB '[ ]'$ ; Exertional SOB '[ ]'$ ; Orthopnea '[ ]'$ ; Pedal Edema '[ ]'$ ; Palpitations '[ ]'$ ; Syncope '[ ]'$ ; Presyncope '[ ]'$ ; Paroxysmal nocturnal dyspnea'[ ]'$   Pulmonary: Cough '[ ]'$ ; Wheezing'[ ]'$ ; Hemoptysis'[ ]'$ ; Sputum '[ ]'$ ; Snoring '[ ]'$   GI: Vomiting'[ ]'$ ; Dysphagia'[ ]'$ ; Melena'[ ]'$ ; Hematochezia '[ ]'$ ;  Heartburn'[ ]'$ ; Abdominal pain '[ ]'$ ; Constipation '[ ]'$ ; Diarrhea '[ ]'$ ; BRBPR '[ ]'$   GU: Hematuria'[ ]'$ ; Dysuria '[ ]'$ ; Nocturia'[ ]'$   Vascular: Pain in legs with walking '[ ]'$ ; Pain in feet with lying flat '[ ]'$ ; Non-healing sores '[ ]'$ ; Stroke '[ ]'$ ; TIA '[ ]'$ ; Slurred speech '[ ]'$ ;  Neuro: Headaches'[ ]'$ ; Vertigo'[ ]'$ ; Seizures'[ ]'$ ; Paresthesias'[ ]'$ ;Blurred vision '[ ]'$ ; Diplopia '[ ]'$ ; Vision changes '[ ]'$   Ortho/Skin: Arthritis '[ ]'$ ; Joint pain '[ ]'$ ; Muscle pain '[ ]'$ ; Joint swelling '[ ]'$ ; Back Pain '[ ]'$ ; Rash '[ ]'$    Psych: Depression'[ ]'$ ; Anxiety'[ ]'$   Heme: Bleeding problems '[ ]'$ ; Clotting disorders '[ ]'$ ; Anemia '[ ]'$   Endocrine: Diabetes '[ ]'$ ; Thyroid dysfunction'[ ]'$    Past Medical History:  Diagnosis Date   Hernia    Hyperlipidemia     Current Outpatient Medications  Medication Sig Dispense Refill   [START ON 04/14/2022] amiodarone (PACERONE) 200 MG tablet Take 2 tablets ('400mg'$ ) twice daily for 2 weeks and then take 1 tablet ('200mg'$ ) once daily. 90 tablet 1   apixaban (ELIQUIS) 5 MG TABS tablet Take 1 tablet (5 mg total) by mouth 2 (two) times daily. 60 tablet 0   aspirin 500 MG EC tablet Take 500 mg by mouth every 6 (six) hours as needed for pain.     furosemide (LASIX) 40 MG tablet Take 1 tablet (40 mg total) by mouth daily. 30 tablet 2   losartan (COZAAR) 25 MG tablet Take 0.5 tablets (12.5 mg total) by mouth daily. 15 tablet 2   metoprolol succinate (TOPROL-XL) 50 MG 24 hr tablet Take 1 tablet (50 mg total) by mouth daily. Take with or immediately following a meal. 30 tablet 2   Misc Natural Products (ADRENAL PO) Take by mouth. One daily     NON FORMULARY Gaba calm-one daily     spironolactone (ALDACTONE) 25 MG tablet Take 0.5 tablets (12.5 mg total) by mouth daily. 15 tablet 2   No current facility-administered medications for this encounter.    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  Tobacco Use   Smoking status: Former    Packs/day: 1.00    Years: 10.00    Total pack years: 10.00    Types:  Cigarettes    Quit date: 02/22/1989    Years since quitting: 33.1   Smokeless tobacco: Never  Vaping Use   Vaping Use: Never used  Substance and Sexual Activity   Alcohol use: Not Currently   Drug use: No   Sexual activity: Yes    Birth control/protection: None  Other Topics Concern   Not on file  Social History Narrative   Not on file   Social Determinants of Health   Financial Resource Strain: Low Risk  (04/05/2022)   Overall Financial Resource Strain (CARDIA)    Difficulty of Paying Living Expenses: Not very hard  Food Insecurity: No Food Insecurity (04/05/2022)  Hunger Vital Sign    Worried About Running Out of Food in the Last Year: Never true    Ran Out of Food in the Last Year: Never true  Transportation Needs: No Transportation Needs (04/05/2022)   PRAPARE - Hydrologist (Medical): No    Lack of Transportation (Non-Medical): No  Physical Activity: Not on file  Stress: Not on file  Social Connections: Not on file  Intimate Partner Violence: Not on file      Family History  Problem Relation Age of Onset   Alcohol abuse Mother    Cancer Mother    Cancer Father    Heart disease Father    Diabetes Sister    Colon cancer Neg Hx    Colon polyps Neg Hx    Esophageal cancer Neg Hx    Rectal cancer Neg Hx    Stomach cancer Neg Hx     Vitals:   04/05/22 1509  BP: 120/70  Pulse: 77  SpO2: 99%  Weight: 86.2 kg (190 lb)  Height: '5\' 11"'$  (1.803 m)    PHYSICAL EXAM: General:  Well appearing. No respiratory difficulty HEENT: normal Neck: supple. no JVD. Carotids 2+ bilat; no bruits. No lymphadenopathy or thryomegaly appreciated. Cor: PMI nondisplaced. Regular rate & rhythm. No rubs, gallops or murmurs. Lungs: clear Abdomen: soft, nontender, nondistended. No hepatosplenomegaly. No bruits or masses. Good bowel sounds. Extremities: no cyanosis, clubbing, rash, edema Neuro: alert & oriented x 3, cranial nerves grossly intact. moves all 4  extremities w/o difficulty. Affect pleasant.  ECG: NSR 80 bpm   ASSESSMENT & PLAN:  Chronic Systolic Heart Failure - Echo 7/23 EF 35-40%. 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 Euvolemic on exam w/ NYHA Class I symptoms. ReDs clip 28%. BP normotensive in clinic, but he is concerned about BP dropping too low w/ med titration - c/w slow GDMT per above - Increase Losartan to 25 mg daily. If able to tolerate, consider further transition to Entresto - Continue Spironolactone 12.5 mg daily  - Consider SGLT2i next - Continue Toprol XL  50 mg daily - Recommend repeat echo in 3 months. If EF not improving w/ maintenance of NSR, consider LHC to assess for coronary ischemia   2. Atrial Fibrillation  - s/p DCCV 03/31/22 - Maintaining NSR on EKG today. HR well controlled - Continue amiodarone 200 mg daily. TFT/HFT monitoring per cardiology - Continue Eliquis 5 mg bid  - if recurrence, consider referral to EP for ablation evaluation   3. HTN - controlled on current regimen - GDMT titration per above   NYHA I  GDMT  Diuretic- Lasix 40 mg daily  BB- Toprol XL 50 mg daily  Ace/ARB/ARNI Losartan 25 mg daily  MRA Spironolactone 12.5 mg daily  SGLT2i - not yet. Consider addition next     Referred to HFSW (PCP, Medications, Transportation, ETOH Abuse, Drug Abuse, Insurance, Financial ): No  Refer to Pharmacy:  No Refer to Home Health:  No Refer to Advanced Heart Failure Clinic:  No  Refer to General Cardiology: Yes   Follow up w/ Dr. Gasper Sells as planned in 3 wks

## 2022-04-05 NOTE — Patient Instructions (Addendum)
INCREASE Losartan to 25 mg, one tab daily  Labs today We will only contact you if something comes back abnormal or we need to make some changes. Otherwise no news is good news!  Thank you for allowing [place] to provider your heart failure care after your recent hospitalization. Please follow-up with cardiology as scheduled  Do the following things EVERYDAY: Weigh yourself in the morning before breakfast. Write it down and keep it in a log. Take your medicines as prescribed Eat low salt foods--Limit salt (sodium) to 2000 mg per day.  Stay as active as you can everyday Limit all fluids for the day to less than 2 liters

## 2022-04-05 NOTE — Progress Notes (Signed)
ReDS Vest / Clip - 04/05/22 1509       ReDS Vest / Clip   Station Marker D    Ruler Value 33    ReDS Value Range Low volume    ReDS Actual Value 28    Anatomical Comments sitting

## 2022-04-05 NOTE — Telephone Encounter (Signed)
Transition Care Management Unsuccessful Follow-up Telephone Call  Date of discharge and from where:  03/31/22 from [facility]  Attempts:  3rd Attempt  Reason for unsuccessful TCM follow-up call:  Left voice message

## 2022-04-11 ENCOUNTER — Ambulatory Visit (INDEPENDENT_AMBULATORY_CARE_PROVIDER_SITE_OTHER): Payer: 59 | Admitting: Sports Medicine

## 2022-04-11 ENCOUNTER — Encounter: Payer: Self-pay | Admitting: Sports Medicine

## 2022-04-11 DIAGNOSIS — I4891 Unspecified atrial fibrillation: Secondary | ICD-10-CM | POA: Diagnosis not present

## 2022-04-11 DIAGNOSIS — I5021 Acute systolic (congestive) heart failure: Secondary | ICD-10-CM | POA: Diagnosis not present

## 2022-04-11 DIAGNOSIS — Z0000 Encounter for general adult medical examination without abnormal findings: Secondary | ICD-10-CM | POA: Diagnosis not present

## 2022-04-11 NOTE — Progress Notes (Signed)
    Procedures performed today:    None.  Independent interpretation of notes and tests performed by another provider:   None.  Brief History, Exam, Impression, and Recommendations:    Acute systolic CHF (congestive heart failure) (HCC) Aaron Crawford is a very pleasant 61 year old male, he was recently hospitalized earlier this month for shortness of breath, orthopnea, ultimately found to be in atrial fibrillation, low ejection fraction at 35 to 40%. He does have a history of heavy alcohol consumption, down to 12 beers per week.  He has cut this out. Cardioversion x3, initially successful then reverted back into atrial fibrillation, medically cardioverted with amiodarone and currently on Eliquis. His CHA2DS2-VASc score = 1. He will continue his follow-up with cardiology, from a hemodynamic standpoint he is euvolemic, orthopnea, PND all resolved, no chest pain, clinically he is in sinus rhythm. He is on metoprolol, spironolactone, and an ARB. We may consider SGLT2 in the future if we need additional volume control. We may also consider the discontinuance of Eliquis considering his low CHA2DS2-VASc score.  Atrial fibrillation with rapid ventricular response (HCC) Aaron Crawford is a very pleasant 61 year old male, he was recently hospitalized earlier this month for shortness of breath, orthopnea, ultimately found to be in atrial fibrillation, low ejection fraction at 35 to 40%. He does have a history of heavy alcohol consumption, down to 12 beers per week.  He has cut this out. Cardioversion x3, initially successful then reverted back into atrial fibrillation, medically cardioverted with amiodarone and currently on Eliquis. His CHA2DS2-VASc score = 1. He will continue his follow-up with cardiology, from a hemodynamic standpoint he is euvolemic, orthopnea, PND all resolved, no chest pain, clinically he is in sinus rhythm. He is on metoprolol, spironolactone, and an ARB. We may consider SGLT2 in the future if  we need additional volume control. We may also consider the discontinuance of Eliquis considering his low CHA2DS2-VASc score.  Annual physical exam Declines Shingrix.  I spent 30 minutes of total time managing this patient today, this includes chart review, face to face, and non-face to face time.  ____________________________________________ Gwen Her. Dianah Field, M.D., ABFM., CAQSM., AME. Primary Care and Sports Medicine [facility]  Adjunct Professor of [employer] [facility] [employer] [employer]  Risk manager

## 2022-04-11 NOTE — Assessment & Plan Note (Addendum)
Aaron Crawford is a very pleasant 61 year old male, he was recently hospitalized earlier this month for shortness of breath, orthopnea, ultimately found to be in atrial fibrillation, low ejection fraction at 35 to 40%. He does have a history of heavy alcohol consumption, down to 12 beers per week.  He has cut this out. Cardioversion x3, initially successful then reverted back into atrial fibrillation, medically cardioverted with amiodarone and currently on Eliquis. His CHA2DS2-VASc score = 1. He will continue his follow-up with cardiology, from a hemodynamic standpoint he is euvolemic, orthopnea, PND all resolved, no chest pain, clinically he is in sinus rhythm. He is on metoprolol, spironolactone, and an ARB. We may consider SGLT2 in the future if we need additional volume control. We may also consider the discontinuance of Eliquis considering his low CHA2DS2-VASc score.

## 2022-04-11 NOTE — Assessment & Plan Note (Signed)
Declines Shingrix.

## 2022-04-11 NOTE — Assessment & Plan Note (Signed)
Aaron Crawford is a very pleasant 61 year old male, he was recently hospitalized earlier this month for shortness of breath, orthopnea, ultimately found to be in atrial fibrillation, low ejection fraction at 35 to 40%. He does have a history of heavy alcohol consumption, down to 12 beers per week.  He has cut this out. Cardioversion x3, initially successful then reverted back into atrial fibrillation, medically cardioverted with amiodarone and currently on Eliquis. His CHA2DS2-VASc score = 1. He will continue his follow-up with cardiology, from a hemodynamic standpoint he is euvolemic, orthopnea, PND all resolved, no chest pain, clinically he is in sinus rhythm. He is on metoprolol, spironolactone, and an ARB. We may consider SGLT2 in the future if we need additional volume control. We may also consider the discontinuance of Eliquis considering his low CHA2DS2-VASc score.

## 2022-04-13 ENCOUNTER — Ambulatory Visit (HOSPITAL_COMMUNITY): Payer: 59 | Admitting: Physician Assistant

## 2022-04-13 ENCOUNTER — Other Ambulatory Visit (HOSPITAL_COMMUNITY): Payer: Self-pay

## 2022-04-13 ENCOUNTER — Telehealth (HOSPITAL_COMMUNITY): Payer: Self-pay

## 2022-04-13 NOTE — Telephone Encounter (Signed)
Pharmacy Transitions of Care Follow-up Telephone Call  Date of discharge: 03/31/2022  Discharge Diagnosis: Atrial Fibrillation   How have you been since you were released from the hospital? Patient has been doing well since discharge. He has no questions or concerns regarding medications. At recent PCP  f/u with Aundria Mems, MD, the question of discontinuing Eliquis came up due to CHA2Ds2-VASc score of 1. For now, he is continuing the course.   Medication changes made at discharge: START taking: amiodarone (PACERONE)  Start taking on: April 14, 2022 Eliquis (apixaban)  furosemide (LASIX)  metoprolol succinate (TOPROL-XL)  spironolactone (ALDACTONE)  STOP taking: doxycycline 100 MG capsule (VIBRAMYCIN)  GAMMA AMINOBUTYRIC ACID PO  ibuprofen 200 MG tablet (ADVIL)   Medication changes verified by the patient? Yes    Medication Accessibility:  Home Pharmacy[facility]   Was the patient provided with refills on discharged medications? No   Have all prescriptions been transferred from [facility] to home pharmacy? Yes   Is the patient able to afford medications? Has insurance, [employer]     Medication Review:   APIXABAN (ELIQUIS)  - Apixaban 5 mg BID started 03/31/2022 - Discussed importance of taking medication around the same time everyday  - Advised patient of medications to avoid (NSAIDs, ASA)  - Educated that Tylenol (acetaminophen) will be the preferred analgesic to prevent risk of bleeding  - Emphasized importance of monitoring for signs and symptoms of bleeding (abnormal bruising, prolonged bleeding, nose bleeds, bleeding from gums, discolored urine, black tarry stools)  - Advised patient to alert all providers of anticoagulation therapy prior to starting a new medication or having a procedure   Follow-up Appointments:  PCP Hospital f/u appt confirmed? Saw Aundria Mems, MD on 04/11/2022.   [facility] f/u appt confirmed?  Scheduled to see Rudean Haskell, MD on  05/02/2022 @ 9:40 AM.   If their condition worsens, is the pt aware to call PCP or go to the Emergency Dept.? Yes  Final Patient Assessment: Patient has follow-up appointment scheduled with Cardiology 05/02/2022 to discuss continuation of Eliquis.

## 2022-04-21 ENCOUNTER — Encounter: Payer: Self-pay | Admitting: Sports Medicine

## 2022-04-21 NOTE — Telephone Encounter (Signed)
I called and gave patient a GoodRx coupon to use for medication.

## 2022-05-01 NOTE — Progress Notes (Unsigned)
Cardiology Office Note:    Date:  05/02/2022   ID:  Aaron Crawford, DOB Mar 16, 1961, MRN [id number]  PCP:  Silverio Decamp, MD   [place] Providers Cardiologist:  Werner Lean, MD     Referring MD: Silverio Decamp,*   CC: Hospital Follow up   History of Present Illness:    Aaron Crawford is a 61 y.o. male with a hx of HFrEF (new, suspect related to PAF and in the setting of alcohol use), seen in 2023 s/p conservative GDMT titration and s/p TEE/DCCV.  Patient notes that he is doing well after hospitalization.   Since last visit notes that they just came in [place]- one daughter was performing (singing) . 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 Has been alcohol free since his hospitalization.  There are no interval hospital/ED visit.    No chest pain or pressure .  No SOB/DOE and no PND/Orthopnea.  No weight gain or leg swelling.  No palpitations or syncope.  Past Medical History:  Diagnosis Date   Hernia    Hyperlipidemia     Past Surgical History:  Procedure Laterality Date   CARDIOVERSION N/A 03/31/2022   Procedure: CARDIOVERSION;  Surgeon: Pixie Casino, MD;  Location: [facility] ENDOSCOPY;  Service: Cardiovascular;  Laterality: N/A;   TEE WITHOUT CARDIOVERSION N/A 03/31/2022   Procedure: TRANSESOPHAGEAL ECHOCARDIOGRAM (TEE);  Surgeon: Pixie Casino, MD;  Location: [facility] ENDOSCOPY;  Service: Cardiovascular;  Laterality: N/A;    Current Medications: Current Meds  Medication Sig   apixaban (ELIQUIS) 5 MG TABS tablet Take 1 tablet (5 mg total) by mouth 2 (two) times daily.   furosemide (LASIX) 40 MG tablet Take 1 tablet (40 mg total) by mouth daily.   losartan (COZAAR) 25 MG tablet Take 1 tablet (25 mg total) by mouth daily.   metoprolol succinate (TOPROL-XL) 50 MG 24 hr tablet Take 1 tablet (50 mg total) by mouth daily. Take with or immediately following a meal.   spironolactone (ALDACTONE) 25 MG tablet Take 0.5 tablets (12.5 mg total) by mouth daily.    [DISCONTINUED] amiodarone (PACERONE) 200 MG tablet Take 2 tablets ('400mg'$ ) twice daily for 2 weeks and then take 1 tablet ('200mg'$ ) once daily.   [DISCONTINUED] aspirin 500 MG EC tablet Take 500 mg by mouth every 6 (six) hours as needed for pain.   [DISCONTINUED] Misc Natural Products (ADRENAL PO) Take by mouth. 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  Tobacco Use   Smoking status: Former    Packs/day: 1.00    Years: 10.00    Total pack years: 10.00    Types: Cigarettes    Quit date: 02/22/1989    Years since quitting: 33.2   Smokeless tobacco: Never  Vaping Use   Vaping Use: Never used  Substance and Sexual Activity   Alcohol use: Not Currently   Drug use: No   Sexual activity: Yes    Birth control/protection: None  Other Topics Concern   Not on file  Social History Narrative   Not on file   Social Determinants of Health  Financial Resource Strain: Low Risk  (04/05/2022)   Overall Financial Resource Strain (CARDIA)    Difficulty of Paying Living Expenses: Not very hard  Food Insecurity: No Food Insecurity (04/05/2022)   Hunger Vital Sign    Worried About Running Out of Food in the Last Year: Never true    Ran Out of Food in the Last Year: Never true  Transportation Needs: No Transportation Needs (04/05/2022)   PRAPARE - Hydrologist (Medical): No    Lack of Transportation (Non-Medical): No  Physical Activity: Not on file  Stress: Not on file  Social Connections: Not on file    Family History: The patient's family history includes Alcohol abuse in his mother; Cancer in his  father and mother; Diabetes in his sister; Heart disease in his father. There is no history of Colon cancer, Colon polyps, Esophageal cancer, Rectal cancer, or Stomach cancer. Family history of HF-> GM and sister  ROS:   Please see the history of present illness.     All other systems reviewed and are negative.  EKGs/Labs/Other Studies Reviewed:    The following studies were reviewed today:  EKG:  EKG is  ordered today.  The ekg ordered today demonstrates  EKG: SR rate 86   ECHO COMPLETE WO IMAGING ENHANCING AGENT 03/30/2022 1. Left ventricular ejection fraction, by estimation, is 35 to 40%. The left ventricle has moderately decreased function. The left ventricle demonstrates global hypokinesis. Left ventricular diastolic parameters are indeterminate. 2. Right ventricular systolic function is normal. The right ventricular size is normal. There is normal pulmonary artery systolic pressure. The estimated right ventricular systolic pressure is 09.3 mmHg. 3. The mitral valve is normal in structure. Mild mitral valve regurgitation. No evidence of mitral stenosis. 4. The aortic valve is normal in structure. Aortic valve regurgitation is not visualized. No aortic stenosis is present. 5. The inferior vena cava is normal in size with greater than 50% respiratory variability, suggesting right atrial pressure of 3 mmHg.  Transesophageal Echocardiogram: Date: 03/29/22 Results:  1. Left ventricular ejection fraction, by estimation, is 35 to 40%. The  left ventricle has moderately decreased function. The left ventricle  demonstrates global hypokinesis. The left ventricular internal cavity size  was mildly dilated.   2. Right ventricular systolic function is low normal. The right  ventricular size is normal.   3. No left atrial/left atrial appendage thrombus was detected.   4. The mitral valve is grossly normal. Mild mitral valve regurgitation.   5. The aortic valve is tricuspid. Aortic valve  regurgitation is trivial.   6. Aortic dilatation noted. 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   7. 1. Cardioverted 3 time(s).      2. Cardioverted at 200J biphasic x 3. Each cardioversion was  successful, but ultimately degenerated back into atrial fibrillation.   CT Angio Chest PT: Date: 03/29/22 Results: LAD CAC   Recent Labs: 03/29/2022: B Natriuretic Peptide 439.0; TSH 1.120 03/30/2022: ALT 62; Magnesium 2.1 03/31/2022: BUN 13; Creatinine, Ser 1.06; Hemoglobin 14.6; Platelets 301; Potassium 4.9; Sodium 141  Recent Lipid Panel    Component Value Date/Time   CHOL 157 03/30/2022 0307   TRIG 75 03/30/2022 0307   HDL 51 03/30/2022 0307   CHOLHDL 3.1 03/30/2022 0307   VLDL 15 03/30/2022 0307   LDLCALC 91 03/30/2022 0307   LDLCALC 131 (H) 12/07/2020 0000    Risk Assessment/Calculations:    CHA2DS2-VASc Score = 2   This indicates  a 2.2% annual risk of stroke. The patient's score is based upon: CHF History: 1 HTN History: 1 Diabetes History: 0 Stroke History: 0 Vascular Disease History: 0 Age Score: 0 Gender Score: 0          Physical Exam:    VS:  BP 108/68   Pulse 86   Ht '5\' 11"'$  (1.803 m)   Wt 194 lb (88 kg)   SpO2 98%   BMI 27.06 kg/m     Wt Readings from Last 3 Encounters:  05/02/22 194 lb (88 kg)  04/11/22 194 lb (88 kg)  04/05/22 190 lb (86.2 kg)    Gen: No distress   Neck: No JVD Cardiac: No Rubs or Gallops, no murmur, RRR +2 radial pulses Respiratory: Clear to auscultation bilaterally, normal effort, normal  respiratory rate GI: Soft, nontender, non-distended  MS: trivial bilateral pitting edema;  moves all extremities Integument: Skin feels warm Neuro:  At time of evaluation, alert and oriented to person/place/time/situation  Psych: Normal affect, patient feels well  ASSESSMENT:    1. Chronic systolic heart failure ([place])   2. PAF (paroxysmal atrial fibrillation) ([place])   3. Acquired thrombophilia ([place])   4. Coronary artery  calcification     PLAN:    Heart Failure reduced Ejection Fraction  - NYHA class I, Stage C, near euvolemic, etiology from AF vs alcohol suspected - Diuretic regimen: Lasix 40 mg With PAF and with alcohol history (now resolved) CHADSVASC 2 eliquis with acquired thrombophilia LAD CAC Mild SOV Ectasia - NYHA class I, Stage C, euvolemic, etiology from AF - CHADSVASC 2 - on succinate 50 mg - aldactone 12.5 mg PO daily  - losartan 25 mg PO daily; BP  - we will stop his amiodarone, check LFTs and TSH today, in 6 weeks do a 2 week non live ziopatch; if AF will see EP for ablation consideration - will repeat echo based on results would SGLT2i and do Cardiac CT for ischemic eval  Winter follow up me or APP, at that time will check lipids and working on CAD prevention       Medication Adjustments/Labs and Tests Ordered: Current medicines are reviewed at length with the patient today.  Concerns regarding medicines are outlined above.  Orders Placed This Encounter  Procedures   TSH   Hepatic function panel   LONG TERM MONITOR (3-14 DAYS)   EKG 12-Lead   ECHOCARDIOGRAM COMPLETE   No orders of the defined types were placed in this encounter.   Patient Instructions  Medication Instructions:  Your physician has recommended you make the following change in your medication:  STOP: amiodarone  *If you need a refill on your cardiac medications before your next appointment, please call your pharmacy*   Lab Work: TODAY: TSH, LFT  If you have labs (blood work) drawn today and your tests are completely normal, you will receive your results only by: [place] (if you have MyChart) OR A paper copy in the mail If you have any lab test that is abnormal or we need to change your treatment, we will call you to review the results.   Testing/Procedures: Your physician has requested that you have an echocardiogram. Echocardiography is a painless test that uses sound waves to create  images of your heart. It provides your doctor with information about the size and shape of your heart and how well your heart's chambers and valves are working. This procedure takes approximately one hour. There are no restrictions for this procedure.  Your physician has requested that you wear a heart monitor in 2 weeks.    Follow-Up: At [facility] you and your health needs are our priority.  As part of our continuing mission to provide you with exceptional heart care, we have created designated Provider Care Teams.  These Care Teams include your primary Cardiologist (physician) and Advanced Practice Providers (APPs -  Physician Assistants and Nurse Practitioners) who all work together to provide you with the care you need, when you need it.  Your next appointment:   5-6 month(s)  The format for your next appointment:   In Person  Provider:   Werner Lean, MD     Other Instructions ZIO XT- Long Term Monitor Instructions  Your physician has requested you wear a ZIO patch monitor for 14 days.  This is a single patch monitor. [employer] supplies one patch monitor per enrollment. Additional stickers are not available. Please do not apply patch if you will be having a Nuclear Stress Test,  Echocardiogram, Cardiac CT, MRI, or Chest Xray during the period you would be wearing the  monitor. The patch cannot be worn during these tests. You cannot remove and re-apply the  ZIO XT patch monitor.  Your ZIO patch monitor will be mailed 3 day USPS to your address on file. It may take 3-5 days  to receive your monitor after you have been enrolled.  Once you have received your monitor, please review the enclosed instructions. Your monitor  has already been registered assigning a specific monitor serial # to you.  Billing and Patient Assistance Program Information  We have supplied [employer] with any of your insurance information on file for billing purposes. [employer] offers a sliding  scale Patient Assistance Program for patients that do not have  insurance, or whose insurance does not completely cover the cost of the ZIO monitor.  You must apply for the Patient Assistance Program to qualify for this discounted rate.  To apply, please call [employer] at [phone], select option 4, select option 2, ask to apply for  Patient Assistance Program. Theodore Demark will ask your household income, and how many people  are in your household. They will quote your out-of-pocket cost based on that information.  [employer] will also be able to set up a 7-month interest-free payment plan if needed.  Applying the monitor   Shave hair from upper left chest.  Hold abrader disc by orange tab. Rub abrader in 40 strokes over the upper left chest as  indicated in your monitor instructions.  Clean area with 4 enclosed alcohol pads. Let dry.  Apply patch as indicated in monitor instructions. Patch will be placed under collarbone on left  side of chest with arrow pointing upward.  Rub patch adhesive wings for 2 minutes. Remove white label marked "1". Remove the white  label marked "2". Rub patch adhesive wings for 2 additional minutes.  While looking in a mirror, press and release button in center of patch. A small green light will  flash 3-4 times. This will be your only indicator that the monitor has been turned on.  Do not shower for the first 24 hours. You may shower after the first 24 hours.  Press the button if you feel a symptom. You will hear a small click. Record Date, Time and  Symptom in the Patient Logbook.  When you are ready to remove the patch, follow instructions on the last 2 pages of Patient  Logbook. Stick patch monitor onto the last  page of Patient Logbook.  Place Patient Logbook in the blue and white box. Use locking tab on box and tape box closed  securely. The blue and white box has prepaid postage on it. Please place it in the mailbox as  soon as possible. Your physician should  have your test results approximately 7 days after the  monitor has been mailed back to [facility].  Call [place] at [phone] if you have questions regarding  your ZIO XT patch monitor. Call them immediately if you see an orange light blinking on your  monitor.  If your monitor falls off in less than 4 days, contact our Monitor department at [phone].  If your monitor becomes loose or falls off after 4 days call [employer] at [phone] for  suggestions on securing your monitor   Important Information About Sugar         Signed, Werner Lean, MD  05/02/2022 10:46 AM    [place]

## 2022-05-02 ENCOUNTER — Ambulatory Visit: Payer: 59 | Admitting: Internal Medicine

## 2022-05-02 ENCOUNTER — Ambulatory Visit (INDEPENDENT_AMBULATORY_CARE_PROVIDER_SITE_OTHER): Payer: 59

## 2022-05-02 ENCOUNTER — Encounter: Payer: Self-pay | Admitting: Internal Medicine

## 2022-05-02 VITALS — BP 108/68 | HR 86 | Ht 71.0 in | Wt 194.0 lb

## 2022-05-02 DIAGNOSIS — E785 Hyperlipidemia, unspecified: Secondary | ICD-10-CM | POA: Diagnosis not present

## 2022-05-02 DIAGNOSIS — I2510 Atherosclerotic heart disease of native coronary artery without angina pectoris: Secondary | ICD-10-CM | POA: Diagnosis not present

## 2022-05-02 DIAGNOSIS — I5022 Chronic systolic (congestive) heart failure: Secondary | ICD-10-CM

## 2022-05-02 DIAGNOSIS — I480 Paroxysmal atrial fibrillation: Secondary | ICD-10-CM | POA: Diagnosis not present

## 2022-05-02 DIAGNOSIS — D6869 Other thrombophilia: Secondary | ICD-10-CM

## 2022-05-02 DIAGNOSIS — I2584 Coronary atherosclerosis due to calcified coronary lesion: Secondary | ICD-10-CM | POA: Diagnosis not present

## 2022-05-02 LAB — HEPATIC FUNCTION PANEL
ALT: 58 IU/L — ABNORMAL HIGH (ref 0–44)
AST: 37 IU/L (ref 0–40)
Albumin: 4.9 g/dL (ref 3.8–4.9)
Alkaline Phosphatase: 86 IU/L (ref 44–121)
Bilirubin Total: 0.4 mg/dL (ref 0.0–1.2)
Bilirubin, Direct: 0.13 mg/dL (ref 0.00–0.40)
Total Protein: 7.1 g/dL (ref 6.0–8.5)

## 2022-05-02 LAB — TSH: TSH: 0.939 u[IU]/mL (ref 0.450–4.500)

## 2022-05-02 NOTE — Patient Instructions (Signed)
Medication Instructions:  Your physician has recommended you make the following change in your medication:  STOP: amiodarone  *If you need a refill on your cardiac medications before your next appointment, please call your pharmacy*   Lab Work: TODAY: TSH, LFT  If you have labs (blood work) drawn today and your tests are completely normal, you will receive your results only by: [place] (if you have MyChart) OR A paper copy in the mail If you have any lab test that is abnormal or we need to change your treatment, we will call you to review the results.   Testing/Procedures: Your physician has requested that you have an echocardiogram. Echocardiography is a painless test that uses sound waves to create images of your heart. It provides your doctor with information about the size and shape of your heart and how well your heart's chambers and valves are working. This procedure takes approximately one hour. There are no restrictions for this procedure.  Your physician has requested that you wear a heart monitor in 2 weeks.    Follow-Up: At [facility], you and your health needs are our priority.  As part of our continuing mission to provide you with exceptional heart care, we have created designated Provider Care Teams.  These Care Teams include your primary Cardiologist (physician) and Advanced Practice Providers (APPs -  Physician Assistants and Nurse Practitioners) who all work together to provide you with the care you need, when you need it.  Your next appointment:   5-6 month(s)  The format for your next appointment:   In Person  Provider:   Werner Lean, MD     Other Instructions ZIO XT- Long Term Monitor Instructions  Your physician has requested you wear a ZIO patch monitor for 14 days.  This is a single patch monitor. [employer] supplies one patch monitor per enrollment. Additional stickers are not available. Please do not apply patch if you will be  having a Nuclear Stress Test,  Echocardiogram, Cardiac CT, MRI, or Chest Xray during the period you would be wearing the  monitor. The patch cannot be worn during these tests. You cannot remove and re-apply the  ZIO XT patch monitor.  Your ZIO patch monitor will be mailed 3 day USPS to your address on file. It may take 3-5 days  to receive your monitor after you have been enrolled.  Once you have received your monitor, please review the enclosed instructions. Your monitor  has already been registered assigning a specific monitor serial # to you.  Billing and Patient Assistance Program Information  We have supplied [employer] with any of your insurance information on file for billing purposes. [employer] offers a sliding scale Patient Assistance Program for patients that do not have  insurance, or whose insurance does not completely cover the cost of the ZIO monitor.  You must apply for the Patient Assistance Program to qualify for this discounted rate.  To apply, please call [employer] at [phone], select option 4, select option 2, ask to apply for  Patient Assistance Program. Theodore Demark will ask your household income, and how many people  are in your household. They will quote your out-of-pocket cost based on that information.  [employer] will also be able to set up a 84-month interest-free payment plan if needed.  Applying the monitor   Shave hair from upper left chest.  Hold abrader disc by orange tab. Rub abrader in 40 strokes over the upper left chest as  indicated in your monitor  instructions.  Clean area with 4 enclosed alcohol pads. Let dry.  Apply patch as indicated in monitor instructions. Patch will be placed under collarbone on left  side of chest with arrow pointing upward.  Rub patch adhesive wings for 2 minutes. Remove white label marked "1". Remove the white  label marked "2". Rub patch adhesive wings for 2 additional minutes.  While looking in a mirror, press and release button  in center of patch. A small green light will  flash 3-4 times. This will be your only indicator that the monitor has been turned on.  Do not shower for the first 24 hours. You may shower after the first 24 hours.  Press the button if you feel a symptom. You will hear a small click. Record Date, Time and  Symptom in the Patient Logbook.  When you are ready to remove the patch, follow instructions on the last 2 pages of Patient  Logbook. Stick patch monitor onto the last page of Patient Logbook.  Place Patient Logbook in the blue and white box. Use locking tab on box and tape box closed  securely. The blue and white box has prepaid postage on it. Please place it in the mailbox as  soon as possible. Your physician should have your test results approximately 7 days after the  monitor has been mailed back to [facility].  Call [place] at [phone] if you have questions regarding  your ZIO XT patch monitor. Call them immediately if you see an orange light blinking on your  monitor.  If your monitor falls off in less than 4 days, contact our Monitor department at [phone].  If your monitor becomes loose or falls off after 4 days call [employer] at [phone] for  suggestions on securing your monitor   Important Information About Sugar

## 2022-05-02 NOTE — Progress Notes (Unsigned)
ZIO XT mailed to pt's home address. 

## 2022-05-11 ENCOUNTER — Ambulatory Visit (HOSPITAL_COMMUNITY): Payer: 59 | Attending: Cardiology

## 2022-05-11 DIAGNOSIS — I5022 Chronic systolic (congestive) heart failure: Secondary | ICD-10-CM | POA: Diagnosis not present

## 2022-05-11 DIAGNOSIS — I480 Paroxysmal atrial fibrillation: Secondary | ICD-10-CM

## 2022-05-11 LAB — ECHOCARDIOGRAM COMPLETE
Area-P 1/2: 3.99 cm2
S' Lateral: 3.6 cm

## 2022-05-14 ENCOUNTER — Encounter: Payer: Self-pay | Admitting: Internal Medicine

## 2022-05-22 DIAGNOSIS — I480 Paroxysmal atrial fibrillation: Secondary | ICD-10-CM

## 2022-05-22 DIAGNOSIS — I5022 Chronic systolic (congestive) heart failure: Secondary | ICD-10-CM | POA: Diagnosis not present

## 2022-06-09 DIAGNOSIS — I5022 Chronic systolic (congestive) heart failure: Secondary | ICD-10-CM | POA: Diagnosis not present

## 2022-06-22 ENCOUNTER — Encounter: Payer: Self-pay | Admitting: Sports Medicine

## 2022-06-22 ENCOUNTER — Telehealth: Payer: Self-pay

## 2022-06-22 ENCOUNTER — Encounter (HOSPITAL_COMMUNITY): Payer: Self-pay

## 2022-06-22 DIAGNOSIS — I480 Paroxysmal atrial fibrillation: Secondary | ICD-10-CM

## 2022-06-22 DIAGNOSIS — I5022 Chronic systolic (congestive) heart failure: Secondary | ICD-10-CM

## 2022-06-22 NOTE — Telephone Encounter (Signed)
-----   Message from Werner Lean, MD sent at 06/22/2022 11:27 AM EDT ----- Results: SVT without Atrial fibrillation Plan: Continued BB Continued alcohol cessation Echo prior to f/u, if decrease in EF will also send to EP  Werner Lean, MD

## 2022-06-22 NOTE — Telephone Encounter (Signed)
Left a message to call back. Would like to schedule Echo due prior to next OV with Dr. Gasper Sells.

## 2022-06-29 NOTE — Telephone Encounter (Signed)
Scheduler set echo for 09/21/22.  No longer need to contact pt.

## 2022-07-01 ENCOUNTER — Encounter: Payer: Self-pay | Admitting: Internal Medicine

## 2022-07-03 ENCOUNTER — Other Ambulatory Visit: Payer: Self-pay | Admitting: *Deleted

## 2022-07-04 MED ORDER — SPIRONOLACTONE 25 MG PO TABS: 12.5000 mg | ORAL_TABLET | Freq: Every day | ORAL | 3 refills | Status: DC

## 2022-07-04 MED ORDER — FUROSEMIDE 40 MG PO TABS: 40.0000 mg | ORAL_TABLET | Freq: Every day | ORAL | 3 refills | Status: DC

## 2022-07-04 MED ORDER — METOPROLOL SUCCINATE ER 50 MG PO TB24: 50.0000 mg | ORAL_TABLET | Freq: Every day | ORAL | 3 refills | Status: DC

## 2022-07-04 MED ORDER — LOSARTAN POTASSIUM 25 MG PO TABS: 25.0000 mg | ORAL_TABLET | Freq: Every day | ORAL | 3 refills | Status: DC

## 2022-07-04 NOTE — Telephone Encounter (Signed)
Refilled losartan, aldactone, furosemide and metoprolol succinate. Per pt my chart request.

## 2022-08-30 ENCOUNTER — Encounter: Payer: Self-pay | Admitting: Internal Medicine

## 2022-09-01 ENCOUNTER — Encounter: Payer: Self-pay | Admitting: Nurse Practitioner

## 2022-09-01 ENCOUNTER — Ambulatory Visit
Admission: RE | Admit: 2022-09-01 | Discharge: 2022-09-01 | Disposition: A | Discharge status: Home / Self Care | Payer: 59 | Source: Ambulatory Visit | Attending: Nurse Practitioner | Admitting: Nurse Practitioner

## 2022-09-01 ENCOUNTER — Ambulatory Visit (INDEPENDENT_AMBULATORY_CARE_PROVIDER_SITE_OTHER): Payer: 59 | Admitting: Nurse Practitioner

## 2022-09-01 VITALS — BP 130/66 | HR 122 | Ht 71.0 in | Wt 198.0 lb

## 2022-09-01 DIAGNOSIS — M25562 Pain in left knee: Secondary | ICD-10-CM | POA: Insufficient documentation

## 2022-09-01 DIAGNOSIS — I480 Paroxysmal atrial fibrillation: Secondary | ICD-10-CM

## 2022-09-01 DIAGNOSIS — I5022 Chronic systolic (congestive) heart failure: Secondary | ICD-10-CM | POA: Diagnosis present

## 2022-09-01 DIAGNOSIS — I2510 Atherosclerotic heart disease of native coronary artery without angina pectoris: Secondary | ICD-10-CM | POA: Insufficient documentation

## 2022-09-01 DIAGNOSIS — I2584 Coronary atherosclerosis due to calcified coronary lesion: Secondary | ICD-10-CM | POA: Insufficient documentation

## 2022-09-01 DIAGNOSIS — I82402 Acute embolism and thrombosis of unspecified deep veins of left lower extremity: Secondary | ICD-10-CM

## 2022-09-01 DIAGNOSIS — R000 Tachycardia, unspecified: Secondary | ICD-10-CM

## 2022-09-01 MED ORDER — METOPROLOL SUCCINATE ER 25 MG PO TB24: 25.0000 mg | ORAL_TABLET | Freq: Every day | ORAL | 3 refills | Status: DC

## 2022-09-01 NOTE — Patient Instructions (Signed)
Medication Instructions:   INCREASE Toprol one (1) tablet by mouth ( 25 mg) to take daily with your ( 50 mg) tablet to equal (75 mg) daily.   Please make sure you are taking your Eliquis one (1) tablet by mouth ( '5mg'$ ) twice daily.   *If you need a refill on your cardiac medications before your next appointment, please call your pharmacy*   Lab Work:  TODAY!!!! CMET/TSH  If you have labs (blood work) drawn today and your tests are completely normal, you will receive your results only by: [place] (if you have MyChart) OR A paper copy in the mail If you have any lab test that is abnormal or we need to change your treatment, we will call you to review the results.   Testing/Procedures:  Keep your appointment for you echo.    Follow-Up: At [facility], you and your health needs are our priority.  As part of our continuing mission to provide you with exceptional heart care, we have created designated Provider Care Teams.  These Care Teams include your primary Cardiologist (physician) and Advanced Practice Providers (APPs -  Physician Assistants and Nurse Practitioners) who all work together to provide you with the care you need, when you need it.  We recommend signing up for the patient portal called "MyChart".  Sign up information is provided on this After Visit Summary.  MyChart is used to connect with patients for Virtual Visits (Telemedicine).  Patients are able to view lab/test results, encounter notes, upcoming appointments, etc.  Non-urgent messages can be sent to your provider as well.   To learn more about what you can do with MyChart, go to [email or web address].    Your next appointment:   1 month(s)  The format for your next appointment:   In Person  Provider:   Werner Lean, MD     Other Instructions  Send a mychart message on Monday to let Sharyn Lull know how you are doing.   Important Information About Sugar

## 2022-09-01 NOTE — Progress Notes (Signed)
+  Cardiology Office Note:    Date:  09/01/2022   ID:  Aaron Crawford, DOB 26-Jan-1961, MRN [id number]  PCP:  Aaron Decamp, MD   [facility] Cardiologist:  Werner Lean, MD     Referring MD: Aaron Crawford,*   Chief Complaint: elevated pulse  History of Present Illness:    Aaron Crawford is a pleasant 61 y.o. male with a hx of HFrEF, PAF on chronic anticoagulation, and coronary artery calcification on CT.   Hospital admission 03/2022 with new onset HFrEF suspected 2/2 PAF in the setting of alcohol use, seen in 2023 s/p conservative GDMT titration and s/p TEE/DCCV.  Seen in cardiology clinic for follow-up with Dr. Glenford Crawford 05/02/2022.  He was doing well following hospitalization and had had no further EtOH.  No interval hospital/ED visit.  At that visit, amiodarone was stopped with plans to do follow-up heart monitor.  GDMT including metoprolol succinate 50 mg daily, Aldactone 12.5 mg daily, losartan 25 mg daily.  Cardiac monitor 06/22/2022 revealed predominant underlying rhythm was sinus rhythm, many small runs of paroxysmal SVT 10 seconds, no atrial fibrillation or flutter, with plan to continue BB, continue alcohol cessation. Plan to repeat echo prior to 3 month follow-up and refer to EP if EF remains low.  Today, he is here for evaluation of increased heart rate averaging 120-130 bpm and accompanied by palpitations, started a few days ago. Feeling "flutters" occasionally as well which occur every few hours. No increased SOB, chest pain, or syncope. When asked about triggers, reports he started a new job which is stressful and resumed use of Delta 8 via vape. He has since stopped smoking again. Monitors HR with a Smart watch. On one occasion, had an episode of lightheadedness at work and had to be careful of his steps. Symptoms quickly resolved. Additionally, has pain behind his left knee.  He had inadvertently been taking Eliquis only once daily due to a  misunderstanding, thought he was told to take it once daily.   Past Medical History:  Diagnosis Date   Hernia    Hyperlipidemia     Past Surgical History:  Procedure Laterality Date   CARDIOVERSION N/A 03/31/2022   Procedure: CARDIOVERSION;  Surgeon: Aaron Casino, MD;  Location: [facility] ENDOSCOPY;  Service: Cardiovascular;  Laterality: N/A;   TEE WITHOUT CARDIOVERSION N/A 03/31/2022   Procedure: TRANSESOPHAGEAL ECHOCARDIOGRAM (TEE);  Surgeon: Aaron Casino, MD;  Location: [facility] ENDOSCOPY;  Service: Cardiovascular;  Laterality: N/A;    Current Medications: Current Meds  Medication Sig   apixaban (ELIQUIS) 5 MG TABS tablet Take 1 tablet (5 mg total) by mouth 2 (two) times daily.   furosemide (LASIX) 40 MG tablet Take 1 tablet (40 mg total) by mouth daily.   losartan (COZAAR) 25 MG tablet Take 1 tablet (25 mg total) by mouth daily.   metoprolol succinate (TOPROL XL) 25 MG 24 hr tablet Take 1 tablet (25 mg total) by mouth daily. Take with your ( 50 mg ) tablet. Total ( 75 mg) daily.   metoprolol succinate (TOPROL-XL) 50 MG 24 hr tablet Take 1 tablet (50 mg total) by mouth daily. Take with or immediately following a meal.   spironolactone (ALDACTONE) 25 MG tablet Take 0.5 tablets (12.5 mg total) by mouth daily.     Allergies:   Ambien [zolpidem], Penicillins, and Percocet [oxycodone-acetaminophen]   Social History   Socioeconomic History   Marital status: Married    Spouse name: [name]   Number of children:  4   Years of education: 50   Highest education level: Not on file  Occupational History   Occupation: carpenter/ musician    Comment: self employed  Tobacco Use   Smoking status: Former    Packs/day: 1.00    Years: 10.00    Total pack years: 10.00    Types: Cigarettes    Quit date: 02/22/1989    Years since quitting: 33.5   Smokeless tobacco: Never  Vaping Use   Vaping Use: Never used  Substance and Sexual Activity   Alcohol use: Not Currently   Drug use: No   Sexual  activity: Yes    Birth control/protection: None  Other Topics Concern   Not on file  Social History Narrative   Not on file   Social Determinants of Health   Financial Resource Strain: Low Risk  (04/05/2022)   Overall Financial Resource Strain (CARDIA)    Difficulty of Paying Living Expenses: Not very hard  Food Insecurity: No Food Insecurity (04/05/2022)   Hunger Vital Sign    Worried About Running Out of Food in the Last Year: Never true    [place] in the Last Year: Never true  Transportation Needs: No Transportation Needs (04/05/2022)   PRAPARE - Hydrologist (Medical): No    Lack of Transportation (Non-Medical): No  Physical Activity: Not on file  Stress: Not on file  Social Connections: Not on file     Family History: The patient's family history includes Alcohol abuse in his mother; Cancer in his father and mother; Diabetes in his sister; Heart disease in his father. There is no history of Colon cancer, Colon polyps, Esophageal cancer, Rectal cancer, or Stomach cancer.  ROS:   Please see the history of present illness.    + tachycardia + pain behind left knee All other systems reviewed and are negative.  Labs/Other Studies Reviewed:    The following studies were reviewed today:  Cardiac Monitor 06/22/22   Patient had a minimum heart rate of 63 bpm, maximum heart rate of 154 bpm, and average heart rate of 89 bpm.   Predominant underlying rhythm was sinus rhythm.   Many small runs of paroxsymal SVT 10 seconds longest with a max rate of 154 bpm at fastest.   These are regular and do not appear to be related to atrial fibrillation or flutter.   Isolated PACs were rare (<1.0%).   Isolated PVCs were rare (<1.0%).   1st degree heart block noted.   Triggered and diary events associated with sinus rhythm.   Asymptomatic SVT.  Echo 05/11/22 1. Left ventricular ejection fraction by 3D volume is 57 %. The left  ventricle has normal  function. The left ventricle has no regional wall  motion abnormalities. Left ventricular diastolic parameters were normal.  The average left ventricular global  longitudinal strain is -19.3 %. The global longitudinal strain is normal.   2. Right ventricular systolic function is normal. The right ventricular  size is normal.   3. Left atrial size was mildly dilated.   4. The mitral valve is normal in structure. No evidence of mitral valve  regurgitation. No evidence of mitral stenosis.   5. The aortic valve is normal in structure. Aortic valve regurgitation is  not visualized. No aortic stenosis is present.   6. The inferior vena cava is normal in size with greater than 50%  respiratory variability, suggesting right atrial pressure of 3 mmHg.   Comparison(s): 03/30/22  EF 35-40%.    Recent Labs: 03/29/2022: B Natriuretic Peptide 439.0 03/30/2022: Magnesium 2.1 03/31/2022: BUN 13; Creatinine, Ser 1.06; Hemoglobin 14.6; Platelets 301; Potassium 4.9; Sodium 141 05/02/2022: ALT 58; TSH 0.939  Recent Lipid Panel    Component Value Date/Time   CHOL 157 03/30/2022 0307   TRIG 75 03/30/2022 0307   HDL 51 03/30/2022 0307   CHOLHDL 3.1 03/30/2022 0307   VLDL 15 03/30/2022 0307   LDLCALC 91 03/30/2022 0307   LDLCALC 131 (H) 12/07/2020 0000     Risk Assessment/Calculations:    CHA2DS2-VASc Score = 2  This indicates a 2.2% annual risk of stroke. The patient's score is based upon: CHF History: 1 HTN History: 1 Diabetes History: 0 Stroke History: 0 Vascular Disease History: 0 Age Score: 0 Gender Score: 0    Physical Exam:    VS:  BP 130/66   Pulse (!) 122   Ht _0  (1.803 m)   Wt 198 lb (89.8 kg)   SpO2 97%   BMI 27.62 kg/m     Wt Readings from Last 3 Encounters:  09/01/22 198 lb (89.8 kg)  05/02/22 194 lb (88 kg)  04/11/22 194 lb (88 kg)     GEN:  Well nourished, well developed in no acute distress HEENT: Normal NECK: No JVD; No carotid bruits CARDIAC: RRR, no murmurs,  rubs, gallops RESPIRATORY:  Clear to auscultation without rales, wheezing or rhonchi  ABDOMEN: Soft, non-tender, non-distended MUSCULOSKELETAL:  No edema; Varicose veins LLE . 2+ pedal pulses, equal bilaterally SKIN: Warm and dry NEUROLOGIC:  Alert and oriented x 3 PSYCHIATRIC:  Normal affect   EKG:  EKG is ordered today.  The ekg ordered today demonstrates sinus tachycardia at 122 bpm, LAD, no ST abnormality  Diagnoses:    1. Acute deep vein thrombosis (DVT) of left lower extremity, unspecified vein (HCC)   2. Chronic systolic heart failure ([place])   3. PAF (paroxysmal atrial fibrillation) ([place])   4. Coronary artery calcification   5. Sinus tachycardia   6. Acute pain of left knee    Assessment and Plan:     Tachycardia: Recent increase in HR 5-7 days. EKG reveals sinus tachycardia. Rhythm is regular on exam. Trigger may be new stressful job and or smoking Delta 8 (which he has subsequently stopped). Denies EtOH use. Concern for DVT as noted below. He denies shortness of breath. If DVT +, will send him to ED for evaluation of PE.  Resume Eliquis 5 mg twice daily.  Will increase metoprolol succinate to 75 mg daily.  I have asked him to notify us after the weekend of HR and BP readings so that we can closely monitor for tachycardia mediated CHF.   PAF on chronic anticoagulation: EKG reveals sinus tachycardia at 122 bpm.  Has noted increased HR and occasional flutters. No documentation of atrial fib.  We are increasing beta-blocker as noted above. Resume Eliquis 5 mg twice daily.   Left leg pain: He is having pain behind his left knee for the past week. Has varicose veins in that leg. Was inadvertently taking Eliquis only once daily. Will send him for stat vascular ultrasound to evaluate for DVT. Advised him that if u/s is positive for DVT, he will need to go to ED for evaluation of PE.   Chronic HFrEF: LVEF 30%, normal diastolic parameters on echo 04/2022, improved from hospital admission  03/2022 with LVEF 35 to 40%, global HK of LV felt to be 2/2 to atrial fibrillation in the  setting of alcohol use. Mild LE edema, otherwise appears euvolemic on exam.  Denies dyspnea, orthopnea, PND.  Have asked him to notify us in 3 days to closely monitor HR and BP in the setting of previous tachycardia induced CHF. Continue plan for repeat echo 09/21/22.     Disposition: Keep your January appointment with Dr. Gasper Sells  Medication Adjustments/Labs and Tests Ordered: Current medicines are reviewed at length with the patient today.  Concerns regarding medicines are outlined above.  Orders Placed This Encounter  Procedures   TSH   Comp Met (CMET)   EKG 12-Lead   VAS [place] LOWER EXTREMITY VENOUS (DVT)   Meds ordered this encounter  Medications   metoprolol succinate (TOPROL XL) 25 MG 24 hr tablet    Sig: Take 1 tablet (25 mg total) by mouth daily. Take with your ( 50 mg ) tablet. Total ( 75 mg) daily.    Dispense:  90 tablet    Refill:  3    Patient Instructions  Medication Instructions:   INCREASE Toprol one (1) tablet by mouth ( 25 mg) to take daily with your ( 50 mg) tablet to equal (75 mg) daily.   Please make sure you are taking your Eliquis one (1) tablet by mouth ( 12m) twice daily.   *If you need a refill on your cardiac medications before your next appointment, please call your pharmacy*   Lab Work:  TODAY!!!! CMET/TSH  If you have labs (blood work) drawn today and your tests are completely normal, you will receive your results only by: [place](if you have MyChart) OR A paper copy in the mail If you have any lab test that is abnormal or we need to change your treatment, we will call you to review the results.   Testing/Procedures:  Keep your appointment for you echo.    Follow-Up: [facility] and your health needs are our priority.  As part of our continuing mission to provide you with exceptional heart care, we have created designated  Provider Care Teams.  These Care Teams include your primary Cardiologist (physician) and Advanced Practice Providers (APPs -  Physician Assistants and Nurse Practitioners) who all work together to provide you with the care you need, when you need it.  We recommend signing up for the patient portal called "MyChart".  Sign up information is provided on this After Visit Summary.  MyChart is used to connect with patients for Virtual Visits (Telemedicine).  Patients are able to view lab/test results, encounter notes, upcoming appointments, etc.  Non-urgent messages can be sent to your provider as well.   To learn more about what you can do with MyChart, go to [email or web address]    Your next appointment:   1 month(s)  The format for your next appointment:   In Person  Provider:   MWerner Lean MD     Other Instructions  Send a mychart message on Monday to let MSharyn Lullknow how you are doing.   Important Information About Sugar         Signed, SEmmaline Life NP  09/01/2022 12:36 PM    [place]

## 2022-09-02 LAB — COMPREHENSIVE METABOLIC PANEL
ALT: 23 IU/L (ref 0–44)
AST: 20 IU/L (ref 0–40)
Albumin/Globulin Ratio: 1.8 (ref 1.2–2.2)
Albumin: 4.6 g/dL (ref 3.8–4.9)
Alkaline Phosphatase: 90 IU/L (ref 44–121)
BUN/Creatinine Ratio: 15 (ref 9–20)
BUN: 13 mg/dL (ref 6–24)
Bilirubin Total: 0.7 mg/dL (ref 0.0–1.2)
CO2: 23 mmol/L (ref 20–29)
Calcium: 10.2 mg/dL (ref 8.7–10.2)
Chloride: 98 mmol/L (ref 96–106)
Creatinine, Ser: 0.87 mg/dL (ref 0.76–1.27)
Globulin, Total: 2.5 g/dL (ref 1.5–4.5)
Glucose: 97 mg/dL (ref 70–99)
Potassium: 4.5 mmol/L (ref 3.5–5.2)
Sodium: 138 mmol/L (ref 134–144)
Total Protein: 7.1 g/dL (ref 6.0–8.5)
eGFR: 99 mL/min/{1.73_m2} (ref >59)

## 2022-09-02 LAB — TSH: TSH: 0.344 u[IU]/mL — ABNORMAL LOW (ref 0.450–4.500)

## 2022-09-03 ENCOUNTER — Other Ambulatory Visit: Payer: Self-pay | Admitting: Sports Medicine

## 2022-09-03 DIAGNOSIS — R7989 Other specified abnormal findings of blood chemistry: Secondary | ICD-10-CM | POA: Insufficient documentation

## 2022-09-03 NOTE — Assessment & Plan Note (Signed)
Incidentally noted low TSH, rechecking in 2 weeks with T3 and T4, if persistently suppressed TSH we will add additional workup for hyperthyroidism.

## 2022-09-03 NOTE — Progress Notes (Signed)
Low TSH level Incidentally noted low TSH, rechecking in 2 weeks with T3 and T4, if persistently suppressed TSH we will add additional workup for hyperthyroidism.

## 2022-09-08 MED ORDER — METOPROLOL SUCCINATE ER 50 MG PO TB24: 100.0000 mg | ORAL_TABLET | Freq: Every day | ORAL | 3 refills | Status: DC

## 2022-09-08 MED ORDER — METOPROLOL SUCCINATE ER 25 MG PO TB24: 25.0000 mg | ORAL_TABLET | Freq: Every day | ORAL | 3 refills | Status: DC | PRN

## 2022-09-08 NOTE — Addendum Note (Signed)
Addended by: Emmaline Life on: 09/08/2022 01:10 PM   Modules accepted: Orders

## 2022-09-21 ENCOUNTER — Ambulatory Visit (HOSPITAL_COMMUNITY): Payer: 59 | Attending: Internal Medicine

## 2022-09-21 DIAGNOSIS — I5022 Chronic systolic (congestive) heart failure: Secondary | ICD-10-CM | POA: Diagnosis not present

## 2022-09-21 DIAGNOSIS — I480 Paroxysmal atrial fibrillation: Secondary | ICD-10-CM | POA: Diagnosis not present

## 2022-09-21 LAB — ECHOCARDIOGRAM COMPLETE
Area-P 1/2: 4.39 cm2
S' Lateral: 3.35 cm

## 2022-09-29 ENCOUNTER — Encounter: Payer: Self-pay | Admitting: Internal Medicine

## 2022-09-29 ENCOUNTER — Ambulatory Visit: Payer: BC Managed Care – PPO | Attending: Internal Medicine | Admitting: Internal Medicine

## 2022-09-29 VITALS — BP 130/82 | HR 79 | Ht 71.0 in | Wt 198.0 lb

## 2022-09-29 DIAGNOSIS — E785 Hyperlipidemia, unspecified: Secondary | ICD-10-CM | POA: Diagnosis not present

## 2022-09-29 DIAGNOSIS — I2584 Coronary atherosclerosis due to calcified coronary lesion: Secondary | ICD-10-CM | POA: Diagnosis not present

## 2022-09-29 DIAGNOSIS — I5032 Chronic diastolic (congestive) heart failure: Secondary | ICD-10-CM

## 2022-09-29 DIAGNOSIS — I480 Paroxysmal atrial fibrillation: Secondary | ICD-10-CM | POA: Diagnosis not present

## 2022-09-29 DIAGNOSIS — I2510 Atherosclerotic heart disease of native coronary artery without angina pectoris: Secondary | ICD-10-CM | POA: Diagnosis not present

## 2022-09-29 LAB — LIPID PANEL
Chol/HDL Ratio: 4 ratio (ref 0.0–5.0)
Cholesterol, Total: 241 mg/dL — ABNORMAL HIGH (ref 100–199)
HDL: 60 mg/dL (ref >39)
LDL Chol Calc (NIH): 156 mg/dL — ABNORMAL HIGH (ref 0–99)
Triglycerides: 142 mg/dL (ref 0–149)
VLDL Cholesterol Cal: 25 mg/dL (ref 5–40)

## 2022-09-29 LAB — ALT: ALT: 22 IU/L (ref 0–44)

## 2022-09-29 MED ORDER — APIXABAN 5 MG PO TABS: 5.0000 mg | ORAL_TABLET | Freq: Two times a day (BID) | ORAL | 3 refills | Status: DC

## 2022-09-29 MED ORDER — BISOPROLOL FUMARATE 5 MG PO TABS: 2.5000 mg | ORAL_TABLET | Freq: Every day | ORAL | 3 refills | Status: DC

## 2022-09-29 NOTE — Progress Notes (Signed)
Cardiology Office Note:    Date:  09/29/2022   ID:  [name], DOB 1961/05/02, MRN [id number]  PCP:  Silverio Decamp, MD   [facility] Cardiologist:  Werner Lean, MD     Referring MD: Silverio Decamp,*   CC: Hospital Follow up   History of Present Illness:    Aaron Crawford is a 62 y.o. male with a hx of HFrEF (new, suspect related to PAF and in the setting of alcohol use), seen in 2023 s/p conservative GDMT titration and s/p TEE/DCCV. 2023: return of AF RVR. Had APP visit.  Occurred during stress and vaping.  Patient notes that he is doing ok.   He changed jobs recently and it was very stressful. Went back to vaping.  Has stopped a few days prior to visiting   There are no interval hospital/ED visit.    No chest pain or pressure.  No SOB/DOE and no PND/Orthopnea.  No weight gain or leg swelling.  No palpitations or syncope.  When he is in atrial fibrillation he could feel a change in her heart rhythm; thought he did not feel anything when he was in his heart rhythm.  He is not presently taking any medications.  Yesterday he took eliquis in the PM and metoprolol.  He does not feel that it is working and would like another medication.   Past Medical History:  Diagnosis Date   Hernia    Hyperlipidemia     Past Surgical History:  Procedure Laterality Date   CARDIOVERSION N/A 03/31/2022   Procedure: CARDIOVERSION;  Surgeon: Pixie Casino, MD;  Location: [facility] ENDOSCOPY;  Service: Cardiovascular;  Laterality: N/A;   TEE WITHOUT CARDIOVERSION N/A 03/31/2022   Procedure: TRANSESOPHAGEAL ECHOCARDIOGRAM (TEE);  Surgeon: Pixie Casino, MD;  Location: [facility] ENDOSCOPY;  Service: Cardiovascular;  Laterality: N/A;    Current Medications: Current Meds  Medication Sig   bisoprolol (ZEBETA) 5 MG tablet Take 0.5 tablets (2.5 mg total) by mouth daily.   [DISCONTINUED] apixaban (ELIQUIS) 5 MG TABS tablet Take 1 tablet (5 mg total) by mouth 2 (two)  times daily.   [DISCONTINUED] furosemide (LASIX) 40 MG tablet Take 1 tablet (40 mg total) by mouth daily.   [DISCONTINUED] losartan (COZAAR) 25 MG tablet Take 1 tablet (25 mg total) by mouth daily.   [DISCONTINUED] metoprolol succinate (TOPROL XL) 25 MG 24 hr tablet Take 1 tablet (25 mg total) by mouth daily as needed (for sustained heart rate > 130 beats per minute).   [DISCONTINUED] metoprolol succinate (TOPROL-XL) 50 MG 24 hr tablet Take 2 tablets (100 mg total) by mouth daily. Take with or immediately following a meal.   [DISCONTINUED] spironolactone (ALDACTONE) 25 MG tablet Take 0.5 tablets (12.5 mg total) by mouth daily.     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  Tobacco Use   Smoking status: Former    Packs/day: 1.00    Years: 10.00    Total pack years: 10.00    Types: Cigarettes    Quit date: 02/22/1989    Years since quitting: 33.6   Smokeless tobacco: Never  Vaping Use   Vaping Use: Never used  Substance  and Sexual Activity   Alcohol use: Not Currently   Drug use: No   Sexual activity: Yes    Birth control/protection: None  Other Topics Concern   Not on file  Social History Narrative   Not on file   Social Determinants of Health   Financial Resource Strain: Low Risk  (04/05/2022)   Overall Financial Resource Strain (CARDIA)    Difficulty of Paying Living Expenses: Not very hard  Food Insecurity: No Food Insecurity (04/05/2022)   Hunger Vital Sign    Worried About Running Out of Food in the Last Year: Never true    Ran Out of Food in the Last Year: Never true  Transportation Needs: No Transportation Needs (04/05/2022)   PRAPARE - Radiographer, therapeutic (Medical): No    Lack of Transportation (Non-Medical): No  Physical Activity: Not on file  Stress: Not on file  Social Connections: Not on file    Social: wife comes and his girls came to the hospital at our index visit  Family History: The patient's family history includes Alcohol abuse in his mother; Cancer in his father and mother; Diabetes in his sister; Heart disease in his father. There is no history of Colon cancer, Colon polyps, Esophageal cancer, Rectal cancer, or Stomach cancer. Family history of HF-> GM and sister  ROS:   Please see the history of present illness.     All other systems reviewed and are negative.  EKGs/Labs/Other Studies Reviewed:    The following studies were reviewed today:  EKG:  EKG is  ordered today.  The ekg ordered today demonstrates  09/29/22: SR rate 79  04/2022 EKG: SR rate 86   Cardiac Studies & Procedures       ECHOCARDIOGRAM  ECHOCARDIOGRAM COMPLETE 09/21/2022  Narrative ECHOCARDIOGRAM REPORT    Patient Name:   Aaron Crawford   Date of Exam: 09/21/2022 Medical Rec #:  [id number]     Height:       71.0 in Accession #:    [id number]    Weight:       198.0 lb Date of Birth:  12-Jul-1961    BSA:          2.100 m Patient Age:    31 years      BP:           130/66 mmHg Patient Gender: M             HR:           105 bpm. Exam Location:  [facility]  Procedure: 2D Echo, 3D Echo, Cardiac Doppler and Color Doppler  Indications:     I50.22 CHF  History:         Patient has prior history of Echocardiogram examinations, most recent 05/11/2022. CHF, Abnormal ECG, Arrythmias:Atrial Fibrillation; Risk Factors:Family History of Coronary Artery Disease, Dyslipidemia and Former Smoker.  Sonographer:     Deliah Boston RDCS Referring Phys:  Werner Lean Diagnosing Phys: Gwyndolyn Kaufman MD  IMPRESSIONS   1. Left ventricular ejection fraction, by estimation, is 50 to 55%. The left ventricle has low normal function.  The left ventricle has no regional wall motion abnormalities. Left ventricular diastolic parameters are indeterminate. 2. Right ventricular systolic function is normal. The right ventricular size is normal. Tricuspid regurgitation signal is inadequate for assessing PA pressure. 3. Left atrial size was mildly dilated. 4. The mitral valve is normal in structure. Trivial mitral valve regurgitation. 5. The aortic valve is tricuspid.  Aortic valve regurgitation is trivial. No aortic stenosis is present. 6. Aortic dilatation noted. There is mild dilatation of the aortic root, measuring 42 mm. There is borderline dilatation of the ascending aorta, measuring 38 mm. 7. The inferior vena cava is normal in size with greater than 50% respiratory variability, suggesting right atrial pressure of 3 mmHg. 8. Suspect patient may be in possible Afib vs Aflutter during examination as there was no a-wave noted.  Comparison(s): Compared to prior TTE on 04/2022, the LVEF has decreased slightly to 50-55%.  FINDINGS Left Ventricle: Left ventricular ejection fraction, by estimation, is 50 to 55%. The left ventricle has low normal function. The left ventricle has no regional wall motion abnormalities. 3D left ventricular ejection fraction analysis performed but not reported based on interpreter judgement due to suboptimal tracking. The left ventricular internal cavity size was normal in size. There is no left ventricular hypertrophy. Left ventricular diastolic parameters are indeterminate.  Right Ventricle: The right ventricular size is normal. No increase in right ventricular wall thickness. Right ventricular systolic function is normal. Tricuspid regurgitation signal is inadequate for assessing PA pressure.  Left Atrium: Left atrial size was mildly dilated.  Right Atrium: Right atrial size was normal in size.  Pericardium: There is no evidence of pericardial effusion.  Mitral Valve: The mitral valve is normal in  structure. Trivial mitral valve regurgitation.  Tricuspid Valve: The tricuspid valve is normal in structure. Tricuspid valve regurgitation is trivial.  Aortic Valve: The aortic valve is tricuspid. Aortic valve regurgitation is trivial. No aortic stenosis is present.  Pulmonic Valve: The pulmonic valve was normal in structure. Pulmonic valve regurgitation is trivial.  Aorta: Aortic dilatation noted. There is mild dilatation of the aortic root, measuring 42 mm. There is borderline dilatation of the ascending aorta, measuring 38 mm.  Venous: The inferior vena cava is normal in size with greater than 50% respiratory variability, suggesting right atrial pressure of 3 mmHg.  IAS/Shunts: The atrial septum is grossly normal.   LEFT VENTRICLE PLAX 2D LVIDd:         4.60 cm   Diastology LVIDs:         3.35 cm   LV e' medial:    14.30 cm/s LV PW:         0.85 cm   LV E/e' medial:  8.7 LV IVS:        1.00 cm   LV e' lateral:   23.70 cm/s LVOT diam:     2.70 cm   LV E/e' lateral: 5.3 LV SV:         75 LV SV Index:   36 LVOT Area:     5.73 cm  3D Volume EF: 3D EF:        58 % LV EDV:       158 ml LV ESV:       67 ml LV SV:        91 ml  RIGHT VENTRICLE RV Basal diam:  3.00 cm RV S prime:     18.80 cm/s TAPSE (M-mode): 2.4 cm  LEFT ATRIUM             Index        RIGHT ATRIUM           Index LA diam:        3.60 cm 1.71 cm/m   RA Area:     16.90 cm LA Vol (A2C):   93.8 ml 44.68 ml/m  RA Volume:  45.10 ml  21.48 ml/m LA Vol (A4C):   40.0 ml 19.05 ml/m LA Biplane Vol: 65.0 ml 30.96 ml/m AORTIC VALVE LVOT Vmax:   79.72 cm/s LVOT Vmean:  53.725 cm/s LVOT VTI:    0.131 m  AORTA Ao Root diam: 4.20 cm Ao Asc diam:  3.80 cm  MITRAL VALVE MV Area (PHT): 4.39 cm     SHUNTS MV Decel Time: 173 msec     Systemic VTI:  0.13 m MV E velocity: 125.00 cm/s  Systemic Diam: 2.70 cm  Gwyndolyn Kaufman MD Electronically signed by Gwyndolyn Kaufman MD Signature Date/Time:  09/21/2022/12:56:23 PM    Final (Updated)   TEE  ECHO TEE 03/31/2022  Narrative TRANSESOPHOGEAL ECHO REPORT    Patient Name:   ACHILLIES BUEHL Date of Exam: 03/31/2022 Medical Rec #:  [id number]   Height:       71.0 in Accession #:    [id number]  Weight:       193.3 lb Date of Birth:  [birth year]  BSA:          2.078 m Patient Age:    77 years    BP:           137/94 mmHg Patient Gender: M           HR:           98 bpm. Exam Location:  Inpatient  Procedure: Transesophageal Echo, Cardiac Doppler and Color Doppler  Indications:    Atrial fibrillation  History:        Patient has prior history of Echocardiogram examinations, most recent 03/30/2022. Risk Factors:Dyslipidemia.  Sonographer:    [name] Referring Phys: [id number] Margie Billet  PROCEDURE: After discussion of the risks and benefits of a TEE, an informed consent was obtained from the patient. TEE procedure time was 6 minutes. The transesophogeal probe was passed without difficulty through the esophogus of the patient. Imaged were obtained with the patient in a supine position. Sedation performed by different physician. The patient was monitored while under deep sedation. Anesthestetic sedation was provided intravenously by Anesthesiology: 348.'88mg'$  of Propofol, '100mg'$  of Lidocaine. Image quality was good. The patient developed no complications during the procedure. 1. Cardioverted 3 time(s). 2. Cardioverted at 200J biphasic x 3. Each cardioversion was successful, but ultimately degenerated back into atrial fibrillation.  IMPRESSIONS   1. Left ventricular ejection fraction, by estimation, is 35 to 40%. The left ventricle has moderately decreased function. The left ventricle demonstrates global hypokinesis. The left ventricular internal cavity size was mildly dilated. 2. Right ventricular systolic function is low normal. The right ventricular size is normal. 3. No left atrial/left atrial appendage thrombus was  detected. 4. The mitral valve is grossly normal. Mild mitral valve regurgitation. 5. The aortic valve is tricuspid. Aortic valve regurgitation is trivial. 6. Aortic dilatation noted. 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 7. 1. Cardioverted 3 time(s). 2. Cardioverted at 200J biphasic x 3. Each cardioversion was successful, but ultimately degenerated back into atrial fibrillation.  Conclusion(s)/Recommendation(s): No LA/LAA thrombus identified. Unsuccessful cardioversion performed without restoration of normal sinus rhythm.  FINDINGS Left Ventricle: Left ventricular ejection fraction, by estimation, is 35 to 40%. The left ventricle has moderately decreased function. The left ventricle demonstrates global hypokinesis. The left ventricular internal cavity size was mildly dilated. There is no left ventricular hypertrophy.  Right Ventricle: The right ventricular size is normal. No increase in right ventricular wall thickness. Right ventricular systolic function is low normal.  Left  Atrium: Left atrial size was normal in size. No left atrial/left atrial appendage thrombus was detected.  Right Atrium: Right atrial size was normal in size.  Pericardium: There is no evidence of pericardial effusion.  Mitral Valve: The mitral valve is grossly normal. Mild mitral valve regurgitation, with centrally-directed jet.  Tricuspid Valve: The tricuspid valve is grossly normal. Tricuspid valve regurgitation is trivial.  Aortic Valve: The aortic valve is tricuspid. Aortic valve regurgitation is trivial.  Pulmonic Valve: The pulmonic valve was normal in structure. Pulmonic valve regurgitation is not visualized.  Aorta: Aortic dilatation noted. 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  IAS/Shunts: No atrial level shunt detected by color flow Doppler.    AORTA Ao Root diam: 4.30 cm Ao Asc diam:  3.40 cm  Lyman Bishop MD Electronically signed by Lyman Bishop  MD Signature Date/Time: 03/31/2022/3:16:20 PM    Final   MONITORS  LONG TERM MONITOR (3-14 DAYS) 06/22/2022  Narrative   Patient had a minimum heart rate of 63 bpm, maximum heart rate of 154 bpm, and average heart rate of 89 bpm.   Predominant underlying rhythm was sinus rhythm.   Many small runs of paroxsymal SVT 10 seconds longest with a max rate of 154 bpm at fastest.   These are regular and do not appear to be related to atrial fibrillation or flutter.   Isolated PACs were rare (<1.0%).   Isolated PVCs were rare (<1.0%).   1st degree heart block noted.   Triggered and diary events associated with sinus rhythm.  Asymptomatic SVT.             Recent Labs: 03/29/2022: B Natriuretic Peptide 439.0 03/30/2022: Magnesium 2.1 03/31/2022: Hemoglobin 14.6; Platelets 301 09/01/2022: ALT 23; BUN 13; Creatinine, Ser 0.87; Potassium 4.5; Sodium 138; TSH 0.344  Recent Lipid Panel    Component Value Date/Time   CHOL 157 03/30/2022 0307   TRIG 75 03/30/2022 0307   HDL 51 03/30/2022 0307   CHOLHDL 3.1 03/30/2022 0307   VLDL 15 03/30/2022 0307   LDLCALC 91 03/30/2022 0307   LDLCALC 131 (H) 12/07/2020 0000    Risk Assessment/Calculations:    CHA2DS2-VASc Score = 2   This indicates a 2.2% annual risk of stroke. The patient's score is based upon: CHF History: 1 HTN History: 1 Diabetes History: 0 Stroke History: 0 Vascular Disease History: 0 Age Score: 0 Gender Score: 0          Physical Exam:    VS:  BP 130/82   Pulse 79   Ht '5\' 11"'$  (1.803 m)   Wt 198 lb (89.8 kg)   SpO2 96%   BMI 27.62 kg/m     Wt Readings from Last 3 Encounters:  09/29/22 198 lb (89.8 kg)  09/01/22 198 lb (89.8 kg)  05/02/22 194 lb (88 kg)    Gen: No distress   Neck: No JVD Cardiac: No rubs or gallops, no murmur, RRR +2 radial pulses Respiratory: Clear to auscultation bilaterally, normal effort, normal  respiratory rate GI: Soft, nontender, non-distended  MS: no edema;  moves all  extremities Integument: Skin feels warm Neuro:  At time of evaluation, alert and oriented to person/place/time/situation  Psych: Normal affect, patient feels well  ASSESSMENT:    1. Chronic heart failure with preserved ejection fraction ([place])   2. PAF (paroxysmal atrial fibrillation) ([place])   3. Coronary artery calcification   4. Hyperlipidemia, unspecified hyperlipidemia type     PLAN:    Heart Failure Recovered  Ejection Fraction  - NYHA class I, Stage C, euvolemic, etiology from AF suspected - Diuretic regimen: None With PAF  CHADSVASC 2  acquired thrombophilia - LAD CAC not used but HTN used - NYHA class I, Stage C, euvolemic, etiology from AF - he has stopped taking all his medications - discussed stroke risk will resume eliquis 5 mg PO BID; offered [facility] but he was ok taking twice a day medication - stop meds he is not actually taking - start bisoprolol 2.5 mg PO Daily  I would like him to consider A fib ablation; he is hesitant to do so; we will reconvene in the spring; if worsening AF in interim; will recommend EP referral  HLD and LAD CAC -LDL goal < 70; LDL today I will recommend medication if above goa (discussed with patient and family  Mild Aortic/SOV Ectasia - Echo 12/24 unless getting CT-PV  Spring me or APP  Time Spent Directly with Patient:   I have spent a total of 40 minutes with the patient reviewing notes, imaging, EKGs, labs and examining the patient as well as establishing an assessment and plan that was discussed personally with the patient.  > 50% of time was spent in direct patient care and family.      Medication Adjustments/Labs and Tests Ordered: Current medicines are reviewed at length with the patient today.  Concerns regarding medicines are outlined above.  Orders Placed This Encounter  Procedures   Lipid panel   ALT   EKG 12-Lead   Meds ordered this encounter  Medications   apixaban (ELIQUIS) 5 MG TABS tablet    Sig: Take 1 tablet  (5 mg total) by mouth 2 (two) times daily.    Dispense:  180 tablet    Refill:  3   bisoprolol (ZEBETA) 5 MG tablet    Sig: Take 0.5 tablets (2.5 mg total) by mouth daily.    Dispense:  45 tablet    Refill:  3    Patient Instructions  Medication Instructions:  Your physician has recommended you make the following change in your medication:  TAKE: apixaban (Eliquis) 5 mg by mouth twice daily  TAKE: bisoprolol (Zebeta) 2.5 mg by mouth once daily  *If you need a refill on your cardiac medications before your next appointment, please call your pharmacy*   Lab Work: TODAY: FLP, ALT  If you have labs (blood work) drawn today and your tests are completely normal, you will receive your results only by: [place] (if you have MyChart) OR A paper copy in the mail If you have any lab test that is abnormal or we need to change your treatment, we will call you to review the results.   Testing/Procedures: NONE   Follow-Up: At [facility], you and your health needs are our priority.  As part of our continuing mission to provide you with exceptional heart care, we have created designated Provider Care Teams.  These Care Teams include your primary Cardiologist (physician) and Advanced Practice Providers (APPs -  Physician Assistants and Nurse Practitioners) who all work together to provide you with the care you need, when you need it.    Your next appointment:   4-5 month(s)  The format for your next appointment:   In Person  Provider:   Werner Lean, MD      Important Information About Sugar         Signed, Werner Lean, MD  09/29/2022 9:47 AM    [place]  [facility]

## 2022-09-29 NOTE — Patient Instructions (Signed)
Medication Instructions:  Your physician has recommended you make the following change in your medication:  TAKE: apixaban (Eliquis) 5 mg by mouth twice daily  TAKE: bisoprolol (Zebeta) 2.5 mg by mouth once daily  *If you need a refill on your cardiac medications before your next appointment, please call your pharmacy*   Lab Work: TODAY: FLP, ALT  If you have labs (blood work) drawn today and your tests are completely normal, you will receive your results only by: [place] (if you have MyChart) OR A paper copy in the mail If you have any lab test that is abnormal or we need to change your treatment, we will call you to review the results.   Testing/Procedures: NONE   Follow-Up: At [facility], you and your health needs are our priority.  As part of our continuing mission to provide you with exceptional heart care, we have created designated Provider Care Teams.  These Care Teams include your primary Cardiologist (physician) and Advanced Practice Providers (APPs -  Physician Assistants and Nurse Practitioners) who all work together to provide you with the care you need, when you need it.    Your next appointment:   4-5 month(s)  The format for your next appointment:   In Person  Provider:   Werner Lean, MD      Important Information About Sugar

## 2022-10-12 ENCOUNTER — Ambulatory Visit: Payer: 59 | Admitting: Sports Medicine

## 2022-10-16 ENCOUNTER — Ambulatory Visit: Payer: BC Managed Care – PPO | Admitting: Sports Medicine

## 2022-10-16 ENCOUNTER — Encounter: Payer: Self-pay | Admitting: Sports Medicine

## 2022-10-16 VITALS — BP 130/88 | HR 114

## 2022-10-16 DIAGNOSIS — E785 Hyperlipidemia, unspecified: Secondary | ICD-10-CM

## 2022-10-16 DIAGNOSIS — I5022 Chronic systolic (congestive) heart failure: Secondary | ICD-10-CM | POA: Diagnosis not present

## 2022-10-16 DIAGNOSIS — I480 Paroxysmal atrial fibrillation: Secondary | ICD-10-CM | POA: Diagnosis not present

## 2022-10-16 MED ORDER — ROSUVASTATIN CALCIUM 5 MG PO TABS: 5.0000 mg | ORAL_TABLET | Freq: Every day | ORAL | 3 refills | Status: DC

## 2022-10-16 MED ORDER — EMPAGLIFLOZIN 10 MG PO TABS: 10.0000 mg | ORAL_TABLET | Freq: Every day | ORAL | 11 refills | Status: DC

## 2022-10-16 MED ORDER — BISOPROLOL FUMARATE 10 MG PO TABS: 10.0000 mg | ORAL_TABLET | Freq: Every day | ORAL | 11 refills | Status: DC

## 2022-10-16 NOTE — Assessment & Plan Note (Signed)
Pleasant 62 year old male, chronic systolic heart failure, he did have atrial fibrillation status post DC cardioversion. Most recent echo revealed an ejection fraction slightly decreased around 50%. He was on several medications but was not taking them consistently so they were discontinued. We had a discussion about mortality reduction in systolic heart failure, he does understand that we typically have to use somewhat of a recipe of medications to provide the maximum cardiovascular risk reduction. Considering his heart rate today we will increase his bisoprolol to 10 mg daily, he was taking a full 5 mg tablet. I will also restart his losartan low-dose, spironolactone low-dose, and add low-dose Jardiance. He is euvolemic today so can hold off on Lasix. I would like to see him back in about 3 months to ensure things are going well and he will check his blood pressures and pulse rates and let me know over the next couple of weeks.

## 2022-10-16 NOTE — Assessment & Plan Note (Signed)
Elevated lipids, patient agrees to Crestor.

## 2022-10-16 NOTE — Assessment & Plan Note (Signed)
Continue Eliquis twice daily. °

## 2022-10-16 NOTE — Progress Notes (Signed)
    Procedures performed today:    None.  Independent interpretation of notes and tests performed by another provider:   None.  Brief History, Exam, Impression, and Recommendations:    Chronic systolic heart failure ([place]) Pleasant 61 year old male, chronic systolic heart failure, he did have atrial fibrillation status post DC cardioversion. Most recent echo revealed an ejection fraction slightly decreased around 50%. He was on several medications but was not taking them consistently so they were discontinued. We had a discussion about mortality reduction in systolic heart failure, he does understand that we typically have to use somewhat of a recipe of medications to provide the maximum cardiovascular risk reduction. Considering his heart rate today we will increase his bisoprolol to 10 mg daily, he was taking a full 5 mg tablet. I will also restart his losartan low-dose, spironolactone low-dose, and add low-dose Jardiance. He is euvolemic today so can hold off on Lasix. I would like to see him back in about 3 months to ensure things are going well and he will check his blood pressures and pulse rates and let me know over the next couple of weeks.  PAF (paroxysmal atrial fibrillation) (HCC) Continue Eliquis twice daily.  Hyperlipemia Elevated lipids, patient agrees to Crestor.    ____________________________________________ Gwen Her. Dianah Field, M.D., ABFM., CAQSM., AME. Primary Care and Sports Medicine [facility]  Adjunct Professor of [place] [employer] [facility] [employer]  Risk manager

## 2022-11-06 ENCOUNTER — Encounter: Payer: Self-pay | Admitting: Sports Medicine

## 2022-11-08 ENCOUNTER — Telehealth: Payer: Self-pay

## 2022-11-08 NOTE — Telephone Encounter (Addendum)
Initiated Prior authorization NO:[id number] 10MG tablets Via: Covermymeds Case/Key:[id number] Status: approved  as of 11/08/22 Reason:Authorization Expiration Date: 11/08/2023  Notified Pt via: Mychart

## 2022-11-13 ENCOUNTER — Encounter (INDEPENDENT_AMBULATORY_CARE_PROVIDER_SITE_OTHER): Payer: BC Managed Care – PPO | Admitting: Internal Medicine

## 2022-11-13 DIAGNOSIS — I5022 Chronic systolic (congestive) heart failure: Secondary | ICD-10-CM | POA: Diagnosis not present

## 2022-11-14 NOTE — Telephone Encounter (Signed)
Please see the MyChart message reply(ies) for my assessment and plan.    This patient gave consent for this Medical Advice Message and is aware that it may result in a bill to [employer], as well as the possibility of receiving a bill for a co-payment or deductible. They are an established patient, but are not seeking medical advice exclusively about a problem treated during an in person or video visit in the last seven days. I did not recommend an in person or video visit within seven days of my reply.    I spent a total of 10 minutes cumulative time within 7 days through [employer].  Werner Lean, MD

## 2022-12-12 ENCOUNTER — Ambulatory Visit: Payer: BC Managed Care – PPO | Admitting: Sports Medicine

## 2022-12-12 ENCOUNTER — Encounter: Payer: Self-pay | Admitting: Sports Medicine

## 2022-12-12 VITALS — BP 119/69 | HR 78 | Wt 200.0 lb

## 2022-12-12 DIAGNOSIS — H6123 Impacted cerumen, bilateral: Secondary | ICD-10-CM

## 2022-12-12 DIAGNOSIS — I5022 Chronic systolic (congestive) heart failure: Secondary | ICD-10-CM

## 2022-12-12 MED ORDER — SPIRONOLACTONE 25 MG PO TABS: 12.5000 mg | ORAL_TABLET | Freq: Every day | ORAL | 3 refills | Status: DC

## 2022-12-12 NOTE — Assessment & Plan Note (Signed)
Repeat bilateral cerumen impactions, I was able to clear it with curettage and aggressive irrigation. Return to see me as needed. Of note he was having some tinnitus which has resolved now with clearing of his cerumen impactions.

## 2022-12-12 NOTE — Progress Notes (Signed)
    Procedures performed today:    Indication: Cerumen impaction of the left and right ear(s) Medical necessity statement: On physical examination, cerumen impairs clinically significant portions of the external auditory canal, and tympanic membrane. Noted obstructive, copious cerumen that cannot be removed without magnification and instrumentations requiring physician skills Consent: Discussed benefits and risks of procedure and verbal consent obtained Procedure: Patient was prepped for the procedure. Utilized an otoscope to assess and take note of the ear canal, the tympanic membrane, and the presence, amount, and placement of the cerumen. Gentle water irrigation and soft plastic curette was utilized to remove cerumen.  Post procedure examination: shows cerumen was completely removed. Patient tolerated procedure well. The patient is made aware that they may experience temporary vertigo, temporary hearing loss, and temporary discomfort. If these symptom last for more than 24 hours to call the clinic or proceed to the ED.  Independent interpretation of notes and tests performed by another provider:   None.  Brief History, Exam, Impression, and Recommendations:    Bilateral hearing loss due to cerumen impaction Repeat bilateral cerumen impactions, I was able to clear it with curettage and aggressive irrigation. Return to see me as needed. Of note he was having some tinnitus which has resolved now with clearing of his cerumen impactions.    ____________________________________________ Gwen Her. Dianah Field, M.D., ABFM., CAQSM., AME. Primary Care and Sports Medicine [facility]  Adjunct Professor of [place] [employer] [facility] of [employer]  Risk manager

## 2023-01-15 ENCOUNTER — Ambulatory Visit: Payer: BC Managed Care – PPO | Admitting: Sports Medicine

## 2023-01-15 VITALS — BP 122/74 | HR 93 | Ht 71.0 in | Wt 202.0 lb

## 2023-01-15 DIAGNOSIS — E785 Hyperlipidemia, unspecified: Secondary | ICD-10-CM

## 2023-01-15 NOTE — Assessment & Plan Note (Signed)
Currently on Crestor, we can recheck lipids at his 1 year annual physical. Blood pressures under good control, return for physical in near

## 2023-01-15 NOTE — Progress Notes (Signed)
    Procedures performed today:    None.  Independent interpretation of notes and tests performed by another provider:   None.  Brief History, Exam, Impression, and Recommendations:    Hyperlipemia Currently on Crestor, we can recheck lipids at his 1 year annual physical. Blood pressures under good control, return for physical in near    ____________________________________________ Ihor Austin. Benjamin Stain, M.D., ABFM., CAQSM., AME. Primary Care and Sports Medicine [facility]  Adjunct Professor of Family Medicine  [employer] of [facility] [employer]  Restaurant manager, fast food

## 2023-02-09 ENCOUNTER — Ambulatory Visit: Payer: BC Managed Care – PPO | Attending: Internal Medicine | Admitting: Internal Medicine

## 2023-02-09 DIAGNOSIS — I480 Paroxysmal atrial fibrillation: Secondary | ICD-10-CM

## 2023-02-09 NOTE — Progress Notes (Signed)
Patient had to leave and will reschedule.

## 2023-05-24 ENCOUNTER — Encounter: Payer: Self-pay | Admitting: Family Medicine

## 2023-05-24 ENCOUNTER — Ambulatory Visit: Payer: BC Managed Care – PPO | Admitting: Family Medicine

## 2023-05-24 VITALS — BP 136/86 | HR 94 | Ht 71.0 in | Wt 200.2 lb

## 2023-05-24 DIAGNOSIS — R002 Palpitations: Secondary | ICD-10-CM | POA: Insufficient documentation

## 2023-05-24 DIAGNOSIS — E785 Hyperlipidemia, unspecified: Secondary | ICD-10-CM

## 2023-05-24 MED ORDER — ATORVASTATIN CALCIUM 40 MG PO TABS: 40.0000 mg | ORAL_TABLET | Freq: Every day | ORAL | 3 refills | Status: DC

## 2023-05-24 NOTE — Assessment & Plan Note (Signed)
Pleasant 62 yo male presents after palpitations yesterday evening. They are currently gone today. He has been off his beta blocker, cholesterol medicine, and bp medicine. Has a hx of PAF and is followed by cardiology however he missed his last visit due to having to leave because cardiology was running behind.  - he does continue to take his eliquis - we had a long discussion about the importance of each medicine he takes and that he needs to take these - EKG done in clinic shows normal sinus rhythm hr 90 and LAD secondary to HTN. No ST changes - encouraged pt to call cardiology today to make a follow up to discuss plans moving forward.  - provided pt with precautions if he is having palpitations to go to ED   CHA2DS2-VASc = 2 CHF hx: 1 HTN hx: 1 DM hx:0  Strokes: 0 Vascular disease hx: 0  Age: 77 Gender: 0

## 2023-05-24 NOTE — Assessment & Plan Note (Signed)
-   pt says he doesn't have a prescription for the crestor listed in his chart. We had a discussion about importance of cholesterol medicine and started pt on lipitor 40mg

## 2023-05-24 NOTE — Progress Notes (Signed)
Acute Office Visit  Subjective:     Patient ID: Aaron Crawford, male    DOB: 1960-10-18, 62 y.o.   MRN: [id number]  Chief Complaint  Patient presents with   Palpitations    Anxiety and palpitations since last night    HPI Patient is in today for heart palpitations and feeling "off." Symptoms started last night.From chard review patient is followed by cardiology and has paroxysmal afib.   Significant pmh also includes HFrEF. From chart review PAF looks secondary to stress, vaping, and alcohol.   Says he has been off his bisoprolol, losartan, and jardiance for two weeks   Should be on eliquis 5mg  PO BID which patient is still taking Cards recommended afib ablation  Pt has appointment with cards but had to leave   Review of Systems  Constitutional:  Negative for chills and fever.  Respiratory:  Negative for cough and shortness of breath.   Cardiovascular:  Negative for chest pain.  Neurological:  Negative for headaches.        Objective:    BP 136/86 (BP Location: Left Arm, Patient Position: Sitting, Cuff Size: Normal)   Pulse 94   Ht 5\' 11"  (1.803 m)   Wt 200 lb 4 oz (90.8 kg)   SpO2 96%   BMI 27.93 kg/m    Physical Exam Vitals and nursing note reviewed.  Constitutional:      General: He is not in acute distress.    Appearance: Normal appearance.  HENT:     Head: Normocephalic and atraumatic.     Right Ear: External ear normal.     Left Ear: External ear normal.     Nose: Nose normal.  Eyes:     Conjunctiva/sclera: Conjunctivae normal.  Cardiovascular:     Rate and Rhythm: Normal rate and regular rhythm.  Pulmonary:     Effort: Pulmonary effort is normal.     Breath sounds: Normal breath sounds.  Neurological:     General: No focal deficit present.     Mental Status: He is alert and oriented to person, place, and time.  Psychiatric:        Mood and Affect: Mood normal.        Behavior: Behavior normal.        Thought Content: Thought content normal.         Judgment: Judgment normal.     No results found for any visits on 05/24/23.      Assessment & Plan:   Problem List Items Addressed This Visit       Other   Hyperlipemia (Chronic)    - pt says he doesn't have a prescription for the crestor listed in his chart. We had a discussion about importance of cholesterol medicine and started pt on lipitor 40mg        Relevant Medications   atorvastatin (LIPITOR) 40 MG tablet   Palpitations - Primary    Pleasant 62 yo male presents after palpitations yesterday evening. They are currently gone today. He has been off his beta blocker, cholesterol medicine, and bp medicine. Has a hx of PAF and is followed by cardiology however he missed his last visit due to having to leave because cardiology was running behind.  - he does continue to take his eliquis - we had a long discussion about the importance of each medicine he takes and that he needs to take these - EKG done in clinic shows normal sinus rhythm hr 90 and LAD secondary to HTN. No  ST changes - encouraged pt to call cardiology today to make a follow up to discuss plans moving forward.  - provided pt with precautions if he is having palpitations to go to ED   CHA2DS2-VASc = 2 CHF hx: 1 HTN hx: 1 DM hx:0  Strokes: 0 Vascular disease hx: 0  Age: 89 Gender: 0        Meds ordered this encounter  Medications   atorvastatin (LIPITOR) 40 MG tablet    Sig: Take 1 tablet (40 mg total) by mouth daily.    Dispense:  90 tablet    Refill:  3    Return if symptoms worsen or fail to improve.  Aaron Amor, DO

## 2023-05-24 NOTE — Patient Instructions (Addendum)
Pick up your bisoprolol--helps prevent from afib  Losartan--blood pressure medicine that also helps protect the kidneys  Atorvastatin--> cholesterol medicine (helps protect the heart from plaque breaking off and help lower cholesterol)  CALL YOUR CARDIOLOGIST TO SET UP AN APPOINTMENT  - discuss your concerns with empagliflozin (jardiance) with cardiology   Any chest pain, shortness of breath, or palpitations go to ED in the future

## 2023-05-31 NOTE — Addendum Note (Signed)
Addended by: Chalmers Cater on: 05/31/2023 08:41 AM   Modules accepted: Orders

## 2023-06-04 ENCOUNTER — Encounter (INDEPENDENT_AMBULATORY_CARE_PROVIDER_SITE_OTHER): Payer: Self-pay | Admitting: Sports Medicine

## 2023-06-04 DIAGNOSIS — M47816 Spondylosis without myelopathy or radiculopathy, lumbar region: Secondary | ICD-10-CM | POA: Diagnosis not present

## 2023-06-04 MED ORDER — METHOCARBAMOL 500 MG PO TABS: 500.0000 mg | ORAL_TABLET | Freq: Three times a day (TID) | ORAL | 0 refills | Status: DC

## 2023-06-04 NOTE — Telephone Encounter (Signed)

## 2023-07-21 ENCOUNTER — Other Ambulatory Visit: Payer: Self-pay | Admitting: Internal Medicine

## 2023-07-21 DIAGNOSIS — I5022 Chronic systolic (congestive) heart failure: Secondary | ICD-10-CM

## 2023-07-30 ENCOUNTER — Other Ambulatory Visit: Payer: Self-pay | Admitting: Internal Medicine

## 2023-07-30 DIAGNOSIS — I5022 Chronic systolic (congestive) heart failure: Secondary | ICD-10-CM

## 2023-08-10 ENCOUNTER — Encounter: Payer: Self-pay | Admitting: Internal Medicine

## 2023-08-10 ENCOUNTER — Ambulatory Visit: Payer: 59 | Attending: Internal Medicine | Admitting: Internal Medicine

## 2023-08-10 VITALS — BP 120/80 | HR 76 | Ht 71.0 in | Wt 205.8 lb

## 2023-08-10 DIAGNOSIS — I480 Paroxysmal atrial fibrillation: Secondary | ICD-10-CM | POA: Diagnosis not present

## 2023-08-10 DIAGNOSIS — I77810 Thoracic aortic ectasia: Secondary | ICD-10-CM

## 2023-08-10 DIAGNOSIS — I2510 Atherosclerotic heart disease of native coronary artery without angina pectoris: Secondary | ICD-10-CM | POA: Diagnosis not present

## 2023-08-10 DIAGNOSIS — E785 Hyperlipidemia, unspecified: Secondary | ICD-10-CM | POA: Diagnosis not present

## 2023-08-10 DIAGNOSIS — I5022 Chronic systolic (congestive) heart failure: Secondary | ICD-10-CM

## 2023-08-10 NOTE — Progress Notes (Signed)
Cardiology Office Note:    Date:  08/10/2023   ID:  Aaron Crawford, DOB July 14, 1961, MRN [id number]  PCP:  Monica Becton, MD   [facility] Providers Cardiologist:  Christell Constant, MD     Referring MD: Monica Becton,*   CC: Secondary prevention visit  History of Present Illness:    Aaron Crawford is a 62 y.o. male with a hx of HFrEF (new, suspect related to PAF and in the setting of alcohol use), seen in 2023 s/p conservative GDMT titration and s/p TEE/DCCV. 2023: return of AF RVR. Had APP visit.  Occurred during stress and vaping.  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 He has made significant lifestyle changes, including cessation of alcohol and vaping, and has started on SGLT2 inhibitors. He has also experienced a change in employment, moving from a high-stress job to a position with [employer] (pending), which he anticipates will be less stressful.  His daugthers are leaving the house and he and his wife are starting to live the empty nestor lifestyle.  He reports no recurrence of atrial fibrillation and no palpitations. He is currently on a regimen of bisoprolol, losartan, and spironolactone for heart failure, and Eliquis for paroxysmal atrial fibrillation. He also has a mild aortic dilation, which is being monitored.  Mr. Digges has hyperlipidemia and coronary artery calcifications. Despite being on a statin, his cholesterol levels remain higher than desired. He has made significant dietary changes and is open to further modifications to improve his cholesterol levels. He has not reported any side effects or issues with his current medications.  In summary, Aaron Crawford is managing his heart failure and atrial fibrillation well with lifestyle changes and medication. He is proactive in his  care and open to further interventions to improve his health. His aortic dilation and hyperlipidemia are areas of ongoing concern and will require continued monitoring and management.   Past Medical History:  Diagnosis Date   Hernia    Hyperlipidemia     Past Surgical History:  Procedure Laterality Date   CARDIOVERSION N/A 03/31/2022   Procedure: CARDIOVERSION;  Surgeon: Chrystie Nose, MD;  Location: [facility] ENDOSCOPY;  Service: Cardiovascular;  Laterality: N/A;   TEE WITHOUT CARDIOVERSION N/A 03/31/2022   Procedure: TRANSESOPHAGEAL ECHOCARDIOGRAM (TEE);  Surgeon: Chrystie Nose, MD;  Location: [facility] ENDOSCOPY;  Service: Cardiovascular;  Laterality: N/A;    Current Medications: Current Meds  Medication Sig   apixaban (ELIQUIS) 5 MG TABS tablet Take 1 tablet (5 mg total) by mouth 2 (two) times daily.   atorvastatin (LIPITOR) 40 MG tablet Take 1 tablet (40 mg total) by mouth daily.   bisoprolol (ZEBETA) 10 MG tablet Take 1 tablet (10 mg total) by mouth daily.   empagliflozin (JARDIANCE) 10 MG TABS tablet Take 1 tablet (10 mg total) by mouth daily.   losartan (COZAAR) 25 MG tablet TAKE 1 TABLET (25 MG TOTAL) BY MOUTH DAILY.   spironolactone (ALDACTONE) 25 MG tablet Take 0.5 tablets (12.5 mg total) by mouth daily.     Allergies:   Ambien [zolpidem], Penicillins, and Percocet [oxycodone-acetaminophen]   Social History   Socioeconomic History   Marital status: Married    Spouse name: Olegario Messier   Number of children: 4   Years of education: 11   Highest education level: 12th grade  Occupational History   Occupation:  carpenter/ musician    Comment: self employed  Tobacco Use   Smoking status: Former    Current packs/day: 0.00    Average packs/day: 1 pack/day for 10.0 years (10.0 ttl pk-yrs)    Types: Cigarettes    Start date: 02/23/1979    Quit date: 02/22/1989    Years since quitting: 34.4   Smokeless tobacco: Never  Vaping Use   Vaping status: Never Used  Substance and Sexual Activity    Alcohol use: Not Currently   Drug use: No   Sexual activity: Yes    Birth control/protection: None  Other Topics Concern   Not on file  Social History Narrative   Not on file   Social Determinants of Health   Financial Resource Strain: Medium Risk (01/15/2023)   Overall Financial Resource Strain (CARDIA)    Difficulty of Paying Living Expenses: Somewhat hard  Food Insecurity: No Food Insecurity (01/15/2023)   Hunger Vital Sign    Worried About Running Out of Food in the Last Year: Never true    Ran Out of Food in the Last Year: Never true  Transportation Needs: No Transportation Needs (01/15/2023)   PRAPARE - Administrator, Civil Service (Medical): No    Lack of Transportation (Non-Medical): No  Physical Activity: Unknown (01/15/2023)   Exercise Vital Sign    Days of Exercise per Week: 5 days    Minutes of Exercise per Session: Patient declined  Stress: No Stress Concern Present (01/15/2023)   [employer] of Occupational Health - Occupational Stress Questionnaire    Feeling of Stress : Not at all  Social Connections: Socially Integrated (01/15/2023)   Social Connection and Isolation Panel [NHANES]    Frequency of Communication with Friends and Family: More than three times a week    Frequency of Social Gatherings with Friends and Family: Twice a week    Attends Religious Services: More than 4 times per year    Active Member of [employer] or Organizations: Yes    Attends Engineer, structural: More than 4 times per year    Marital Status: Married    Social: wife comes and his girls came to the hospital at our index visit  Family History: The patient's family history includes Alcohol abuse in his mother; Cancer in his father and mother; Diabetes in his sister; Heart disease in his father. There is no history of Colon cancer, Colon polyps, Esophageal cancer, Rectal cancer, or Stomach cancer. Family history of HF-> GM and sister  ROS:   Please see the  history of present illness.     EKGs/Labs/Other Studies Reviewed:    The following studies were reviewed today:  09/29/22: SR rate 79  04/2022 EKG: SR rate 86   Cardiac Studies & Procedures       ECHOCARDIOGRAM  ECHOCARDIOGRAM COMPLETE 09/21/2022  Narrative ECHOCARDIOGRAM REPORT    Patient Name:   Thurmon Greeney   Date of Exam: 09/21/2022 Medical Rec #:  [id number]     Height:       71.0 in Accession #:    [id number]    Weight:       198.0 lb Date of Birth:  16-Dec-1960    BSA:          2.100 m Patient Age:    61 years      BP:           130/66 mmHg Patient Gender: M  HR:           105 bpm. Exam Location:  [address]  Procedure: 2D Echo, 3D Echo, Cardiac Doppler and Color Doppler  Indications:     I50.22 CHF  History:         Patient has prior history of Echocardiogram examinations, most recent 05/11/2022. CHF, Abnormal ECG, Arrythmias:Atrial Fibrillation; Risk Factors:Family History of Coronary Artery Disease, Dyslipidemia and Former Smoker.  Sonographer:     Farrel Conners RDCS Referring Phys:  Christell Constant Diagnosing Phys: Laurance Flatten MD  IMPRESSIONS   1. Left ventricular ejection fraction, by estimation, is 50 to 55%. The left ventricle has low normal function. The left ventricle has no regional wall motion abnormalities. Left ventricular diastolic parameters are indeterminate. 2. Right ventricular systolic function is normal. The right ventricular size is normal. Tricuspid regurgitation signal is inadequate for assessing PA pressure. 3. Left atrial size was mildly dilated. 4. The mitral valve is normal in structure. Trivial mitral valve regurgitation. 5. The aortic valve is tricuspid. Aortic valve regurgitation is trivial. No aortic stenosis is present. 6. Aortic dilatation noted. There is mild dilatation of the aortic root, measuring 42 mm. There is borderline dilatation of the ascending aorta, measuring 38 mm. 7. The inferior vena cava  is normal in size with greater than 50% respiratory variability, suggesting right atrial pressure of 3 mmHg. 8. Suspect patient may be in possible Afib vs Aflutter during examination as there was no a-wave noted.  Comparison(s): Compared to prior TTE on 04/2022, the LVEF has decreased slightly to 50-55%.  FINDINGS Left Ventricle: Left ventricular ejection fraction, by estimation, is 50 to 55%. The left ventricle has low normal function. The left ventricle has no regional wall motion abnormalities. 3D left ventricular ejection fraction analysis performed but not reported based on interpreter judgement due to suboptimal tracking. The left ventricular internal cavity size was normal in size. There is no left ventricular hypertrophy. Left ventricular diastolic parameters are indeterminate.  Right Ventricle: The right ventricular size is normal. No increase in right ventricular wall thickness. Right ventricular systolic function is normal. Tricuspid regurgitation signal is inadequate for assessing PA pressure.  Left Atrium: Left atrial size was mildly dilated.  Right Atrium: Right atrial size was normal in size.  Pericardium: There is no evidence of pericardial effusion.  Mitral Valve: The mitral valve is normal in structure. Trivial mitral valve regurgitation.  Tricuspid Valve: The tricuspid valve is normal in structure. Tricuspid valve regurgitation is trivial.  Aortic Valve: The aortic valve is tricuspid. Aortic valve regurgitation is trivial. No aortic stenosis is present.  Pulmonic Valve: The pulmonic valve was normal in structure. Pulmonic valve regurgitation is trivial.  Aorta: Aortic dilatation noted. There is mild dilatation of the aortic root, measuring 42 mm. There is borderline dilatation of the ascending aorta, measuring 38 mm.  Venous: The inferior vena cava is normal in size with greater than 50% respiratory variability, suggesting right atrial pressure of 3 mmHg.  IAS/Shunts:  The atrial septum is grossly normal.   LEFT VENTRICLE PLAX 2D LVIDd:         4.60 cm   Diastology LVIDs:         3.35 cm   LV e' medial:    14.30 cm/s LV PW:         0.85 cm   LV E/e' medial:  8.7 LV IVS:        1.00 cm   LV e' lateral:   23.70 cm/s LVOT  diam:     2.70 cm   LV E/e' lateral: 5.3 LV SV:         75 LV SV Index:   36 LVOT Area:     5.73 cm  3D Volume EF: 3D EF:        58 % LV EDV:       158 ml LV ESV:       67 ml LV SV:        91 ml  RIGHT VENTRICLE RV Basal diam:  3.00 cm RV S prime:     18.80 cm/s TAPSE (M-mode): 2.4 cm  LEFT ATRIUM             Index        RIGHT ATRIUM           Index LA diam:        3.60 cm 1.71 cm/m   RA Area:     16.90 cm LA Vol (A2C):   93.8 ml 44.68 ml/m  RA Volume:   45.10 ml  21.48 ml/m LA Vol (A4C):   40.0 ml 19.05 ml/m LA Biplane Vol: 65.0 ml 30.96 ml/m AORTIC VALVE LVOT Vmax:   79.72 cm/s LVOT Vmean:  53.725 cm/s LVOT VTI:    0.131 m  AORTA Ao Root diam: 4.20 cm Ao Asc diam:  3.80 cm  MITRAL VALVE MV Area (PHT): 4.39 cm     SHUNTS MV Decel Time: 173 msec     Systemic VTI:  0.13 m MV E velocity: 125.00 cm/s  Systemic Diam: 2.70 cm  Laurance Flatten MD Electronically signed by Laurance Flatten MD Signature Date/Time: 09/21/2022/12:56:23 PM    Final (Updated)   TEE  ECHO TEE 03/31/2022  Narrative TRANSESOPHOGEAL ECHO REPORT    Patient Name:   CLEM MARKEN Date of Exam: 03/31/2022 Medical Rec #:  [id number]   Height:       71.0 in Accession #:    [id number]  Weight:       193.3 lb Date of Birth:  July 02, 1961  BSA:          2.078 m Patient Age:    60 years    BP:           137/94 mmHg Patient Gender: M           HR:           98 bpm. Exam Location:  Inpatient  Procedure: Transesophageal Echo, Cardiac Doppler and Color Doppler  Indications:    Atrial fibrillation  History:        Patient has prior history of Echocardiogram examinations, most recent 03/30/2022. Risk Factors:Dyslipidemia.  Sonographer:     Milda Smart Referring Phys: [id number] Jonita Albee  PROCEDURE: After discussion of the risks and benefits of a TEE, an informed consent was obtained from the patient. TEE procedure time was 6 minutes. The transesophogeal probe was passed without difficulty through the esophogus of the patient. Imaged were obtained with the patient in a supine position. Sedation performed by different physician. The patient was monitored while under deep sedation. Anesthestetic sedation was provided intravenously by Anesthesiology: 348.88mg  of Propofol, 100mg  of Lidocaine. Image quality was good. The patient developed no complications during the procedure. 1. Cardioverted 3 time(s). 2. Cardioverted at 200J biphasic x 3. Each cardioversion was successful, but ultimately degenerated back into atrial fibrillation.  IMPRESSIONS   1. Left ventricular ejection fraction, by estimation, is 35 to 40%. The left ventricle has moderately decreased function. The  left ventricle demonstrates global hypokinesis. The left ventricular internal cavity size was mildly dilated. 2. Right ventricular systolic function is low normal. The right ventricular size is normal. 3. No left atrial/left atrial appendage thrombus was detected. 4. The mitral valve is grossly normal. Mild mitral valve regurgitation. 5. The aortic valve is tricuspid. Aortic valve regurgitation is trivial. 6. Aortic dilatation noted. 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 7. 1. Cardioverted 3 time(s). 2. Cardioverted at 200J biphasic x 3. Each cardioversion was successful, but ultimately degenerated back into atrial fibrillation.  Conclusion(s)/Recommendation(s): No LA/LAA thrombus identified. Unsuccessful cardioversion performed without restoration of normal sinus rhythm.  FINDINGS Left Ventricle: Left ventricular ejection fraction, by estimation, is 35 to 40%. The left ventricle has moderately decreased function. The left ventricle  demonstrates global hypokinesis. The left ventricular internal cavity size was mildly dilated. There is no left ventricular hypertrophy.  Right Ventricle: The right ventricular size is normal. No increase in right ventricular wall thickness. Right ventricular systolic function is low normal.  Left Atrium: Left atrial size was normal in size. No left atrial/left atrial appendage thrombus was detected.  Right Atrium: Right atrial size was normal in size.  Pericardium: There is no evidence of pericardial effusion.  Mitral Valve: The mitral valve is grossly normal. Mild mitral valve regurgitation, with centrally-directed jet.  Tricuspid Valve: The tricuspid valve is grossly normal. Tricuspid valve regurgitation is trivial.  Aortic Valve: The aortic valve is tricuspid. Aortic valve regurgitation is trivial.  Pulmonic Valve: The pulmonic valve was normal in structure. Pulmonic valve regurgitation is not visualized.  Aorta: Aortic dilatation noted. 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  IAS/Shunts: No atrial level shunt detected by color flow Doppler.    AORTA Ao Root diam: 4.30 cm Ao Asc diam:  3.40 cm  Zoila Shutter MD Electronically signed by Zoila Shutter MD Signature Date/Time: 03/31/2022/3:16:20 PM    Final   MONITORS  LONG TERM MONITOR (3-14 DAYS) 06/12/2022  Narrative   Patient had a minimum heart rate of 63 bpm, maximum heart rate of 154 bpm, and average heart rate of 89 bpm.   Predominant underlying rhythm was sinus rhythm.   Many small runs of paroxsymal SVT 10 seconds longest with a max rate of 154 bpm at fastest.   These are regular and do not appear to be related to atrial fibrillation or flutter.   Isolated PACs were rare (<1.0%).   Isolated PVCs were rare (<1.0%).   1st degree heart block noted.   Triggered and diary events associated with sinus rhythm.  Asymptomatic SVT.             Recent Labs: 09/01/2022: BUN 13; Creatinine, Ser  0.87; Potassium 4.5; Sodium 138; TSH 0.344 09/29/2022: ALT 22  Recent Lipid Panel    Component Value Date/Time   CHOL 241 (H) 09/29/2022 0932   TRIG 142 09/29/2022 0932   HDL 60 09/29/2022 0932   CHOLHDL 4.0 09/29/2022 0932   CHOLHDL 3.1 03/30/2022 0307   VLDL 15 03/30/2022 0307   LDLCALC 156 (H) 09/29/2022 0932   LDLCALC 131 (H) 12/07/2020 0000    Physical Exam:    VS:  BP 120/80   Pulse 76   Ht 5\' 11"  (1.803 m)   Wt 205 lb 12.8 oz (93.4 kg)   SpO2 98%   BMI 28.70 kg/m     Wt Readings from Last 3 Encounters:  08/10/23 205 lb 12.8 oz (93.4 kg)  05/24/23 200  lb 4 oz (90.8 kg)  01/15/23 202 lb (91.6 kg)    Gen: No distress   Neck: No JVD Cardiac: No rubs or gallops, no murmur, RRR +2 radial pulses Respiratory: Clear to auscultation bilaterally, normal effort, normal  respiratory rate GI: Soft, nontender, non-distended  MS: no edema;  moves all extremities Integument: Skin feels warm Neuro:  At time of evaluation, alert and oriented to person/place/time/situation  Psych: Normal affect, patient feels well  ASSESSMENT:    1. PAF (paroxysmal atrial fibrillation) (HCC)   2. Chronic systolic heart failure (HCC)   3. Coronary artery calcification   4. Aortic root dilation (HCC)     PLAN:         Heart Failure with Recovered Ejection Fraction - - NYHA class I, Stage C, euvolemic, etiology from AF suspected, no diuretic needed - Heart failure with reduced ejection fraction, now recovered. Asymptomatic with well-controlled blood pressure. Continues on bisoprolol, SGLT2 inhibitor, losartan, and spironolactone. No recent atrial fibrillation. - Continue bisoprolol, SGLT2 inhibitor, losartan, spironolactone - Order echocardiogram to monitor heart function and aortic dilation  Atrial Fibrillation Paroxysmal atrial fibrillation, currently in sinus rhythm. No recent palpitations. Continues on Eliquis for stroke prevention (CHADSVASC 2 HF and CAD). Emphasized Eliquis's role in  reducing stroke risk. 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 Requires annual monitoring.  - Order echocardiogram to monitor aortic dilation  Hyperlipidemia LAD CAC Elevated cholesterol with coronary artery calcifications. On atorvastatin. Discussed dietary modifications and potential medication adjustment based on upcoming labs. American guidelines define LDL under 70, European under 55. Discussed Mediterranean Diet meal planning. - Check LDL direct and LP(a) levels - Continue atorvastatin - Consider increasing atorvastatin if cholesterol remains high - Follow up on lab results and adjust treatment as needed  General Health Maintenance Significant lifestyle changes including cessation of alcohol and smoking. Regular exercise and healthy diet. Discussed benefits of strength training and Mediterranean diet for overall health and cholesterol management.  Follow-up - Follow up in one year unless echocardiogram results indicate earlier review   Medication Adjustments/Labs and Tests Ordered: Current medicines are reviewed at length with the patient today.  Concerns regarding medicines are outlined above.  Orders Placed This Encounter  Procedures   LDL cholesterol, direct   Lipoprotein A (LPA)   EKG 12-Lead   ECHOCARDIOGRAM COMPLETE   No orders of the defined types were placed in this encounter.   Patient Instructions  Medication Instructions:  Your physician recommends that you continue on your current medications as directed. Please refer to the Current Medication list given to you today.  *If you need a refill on your cardiac medications before your next appointment, please call your pharmacy*   Lab Work: LDL Direct, Lpa  If you have labs (blood work) drawn today and your tests are completely normal, you will receive your results only by: MyChart Message (if  you have MyChart) OR A paper copy in the mail If you have any lab test that is abnormal or we need to change your treatment, we will call you to review the results.   Testing/Procedures: Your physician has requested that you have an echocardiogram. Echocardiography is a painless test that uses sound waves to create images of your heart. It provides your doctor with information about the size and shape of your heart and how well your heart's chambers and valves are working. This procedure takes  approximately one hour. There are no restrictions for this procedure. Please do NOT wear cologne, perfume, aftershave, or lotions (deodorant is allowed). Please arrive 15 minutes prior to your appointment time.  Please note: We ask at that you not bring children with you during ultrasound (echo/ vascular) testing. Due to room size and safety concerns, children are not allowed in the ultrasound rooms during exams. Our front office staff cannot provide observation of children in our lobby area while testing is being conducted. An adult accompanying a patient to their appointment will only be allowed in the ultrasound room at the discretion of the ultrasound technician under special circumstances. We apologize for any inconvenience.    Follow-Up: At [facility], you and your health needs are our priority.  As part of our continuing mission to provide you with exceptional heart care, we have created designated Provider Care Teams.  These Care Teams include your primary Cardiologist (physician) and Advanced Practice Providers (APPs -  Physician Assistants and Nurse Practitioners) who all work together to provide you with the care you need, when you need it.  Your next appointment:   1 year(s)  Provider:   Christell Constant, MD        Signed, Christell Constant, MD  08/10/2023 9:17 AM    [facility]

## 2023-08-10 NOTE — Patient Instructions (Signed)
Medication Instructions:  Your physician recommends that you continue on your current medications as directed. Please refer to the Current Medication list given to you today.  *If you need a refill on your cardiac medications before your next appointment, please call your pharmacy*   Lab Work: LDL Direct, Lpa  If you have labs (blood work) drawn today and your tests are completely normal, you will receive your results only by: MyChart Message (if you have MyChart) OR A paper copy in the mail If you have any lab test that is abnormal or we need to change your treatment, we will call you to review the results.   Testing/Procedures: Your physician has requested that you have an echocardiogram. Echocardiography is a painless test that uses sound waves to create images of your heart. It provides your doctor with information about the size and shape of your heart and how well your heart's chambers and valves are working. This procedure takes approximately one hour. There are no restrictions for this procedure. Please do NOT wear cologne, perfume, aftershave, or lotions (deodorant is allowed). Please arrive 15 minutes prior to your appointment time.  Please note: We ask at that you not bring children with you during ultrasound (echo/ vascular) testing. Due to room size and safety concerns, children are not allowed in the ultrasound rooms during exams. Our front office staff cannot provide observation of children in our lobby area while testing is being conducted. An adult accompanying a patient to their appointment will only be allowed in the ultrasound room at the discretion of the ultrasound technician under special circumstances. We apologize for any inconvenience.    Follow-Up: At [facility], you and your health needs are our priority.  As part of our continuing mission to provide you with exceptional heart care, we have created designated Provider Care Teams.  These Care Teams  include your primary Cardiologist (physician) and Advanced Practice Providers (APPs -  Physician Assistants and Nurse Practitioners) who all work together to provide you with the care you need, when you need it.  Your next appointment:   1 year(s)  Provider:   Christell Constant, MD

## 2023-08-12 LAB — LDL CHOLESTEROL, DIRECT: LDL Direct: 85 mg/dL (ref 0–99)

## 2023-08-12 LAB — LIPOPROTEIN A (LPA): Lipoprotein (a): <8.4 nmol/L (ref <75.0)

## 2023-08-15 ENCOUNTER — Telehealth: Payer: Self-pay

## 2023-08-15 DIAGNOSIS — I2510 Atherosclerotic heart disease of native coronary artery without angina pectoris: Secondary | ICD-10-CM

## 2023-08-15 DIAGNOSIS — E785 Hyperlipidemia, unspecified: Secondary | ICD-10-CM

## 2023-08-15 NOTE — Telephone Encounter (Signed)
The patient has been notified of the result and verbalized understanding.  All questions (if any) were answered. Macie Burows, RN 08/15/2023 4:35 PM   Advised pt will need f/u FLP, ALT in May.  If pt has fasting labs drawn with PCP around this time then he will not need these labs drawn.  Orders placed and released for draw.

## 2023-08-15 NOTE — Telephone Encounter (Signed)
-----   Message from Christell Constant sent at 08/12/2023  9:58 PM EST ----- Results: LDL above goal but normal Lp(A) Plan: Repeat labs in 6 months after new diet plan, if still above 70, will start medication  Christell Constant, MD

## 2023-08-17 ENCOUNTER — Other Ambulatory Visit: Payer: Self-pay

## 2023-08-17 ENCOUNTER — Ambulatory Visit
Admission: EM | Admit: 2023-08-17 | Discharge: 2023-08-17 | Disposition: A | Discharge status: Home / Self Care | Payer: 59 | Attending: Family Medicine | Admitting: Family Medicine

## 2023-08-17 DIAGNOSIS — H6123 Impacted cerumen, bilateral: Secondary | ICD-10-CM

## 2023-08-17 NOTE — ED Triage Notes (Signed)
Bil ear pain; R > L. Reports feeling like he needs his ears cleaned out.

## 2023-08-17 NOTE — Discharge Instructions (Signed)
To prevent recurrent ear wax blockage, try the following: Soak two cotton balls with mineral oil, and gently place in each ear canal once weekly.  Leave the cotton balls in place for 10 to 20 minutes.  This will help liquefy the ear wax and aid your body's normal elimination process.  Have your ears cleaned by a health professional every 6 to 12 months.  Avoid using "Q-tips" and ear wax softening solutions.  Call if increased pain or ear drainage develops.

## 2023-08-17 NOTE — ED Provider Notes (Signed)
[facility]    CSN: [id number] Arrival date & time: 08/17/23  1208      History   Chief Complaint Chief Complaint  Patient presents with   Ear Fullness    HPI Aaron Crawford is a 62 y.o. male.   Patient complains of bilateral ear fullness, with onset of increased pain in the right ear today.  He states that he has a history of recurring cerumen impaction requiring removal once or twice yearly.  The history is provided by the patient.    Past Medical History:  Diagnosis Date   Hernia    Hyperlipidemia     Patient Active Problem List   Diagnosis Date Noted   Lumbar spondylosis 06/04/2023   Low TSH level 09/03/2022   PAF (paroxysmal atrial fibrillation) (HCC) 05/02/2022   Acquired thrombophilia (HCC) 05/02/2022   Coronary artery calcification 05/02/2022   Chronic systolic heart failure (HCC) 04/05/2022   Annual physical exam 12/01/2020   History of heavy alcohol consumption 10/02/2017   Bilateral hearing loss due to cerumen impaction 02/02/2017   Skin lesion of right arm 04/19/2016   Left knee pain 04/17/2016   Bilateral inguinal hernia 09/10/2012   Hyperlipemia 04/27/2011    Past Surgical History:  Procedure Laterality Date   CARDIOVERSION N/A 03/31/2022   Procedure: CARDIOVERSION;  Surgeon: Chrystie Nose, MD;  Location: [facility] ENDOSCOPY;  Service: Cardiovascular;  Laterality: N/A;   TEE WITHOUT CARDIOVERSION N/A 03/31/2022   Procedure: TRANSESOPHAGEAL ECHOCARDIOGRAM (TEE);  Surgeon: Chrystie Nose, MD;  Location: [facility] ENDOSCOPY;  Service: Cardiovascular;  Laterality: N/A;       Home Medications    Prior to Admission medications   Medication Sig Start Date End Date Taking? Authorizing Provider  apixaban (ELIQUIS) 5 MG TABS tablet Take 1 tablet (5 mg total) by mouth 2 (two) times daily. 09/29/22   Chandrasekhar, [place] Dragon A, MD  atorvastatin (LIPITOR) 40 MG tablet Take 1 tablet (40 mg total) by mouth daily. 05/24/23   Charlton Amor, DO  bisoprolol (ZEBETA)  10 MG tablet Take 1 tablet (10 mg total) by mouth daily. 10/16/22   Monica Becton, MD  empagliflozin (JARDIANCE) 10 MG TABS tablet Take 1 tablet (10 mg total) by mouth daily. 10/16/22   Monica Becton, MD  losartan (COZAAR) 25 MG tablet TAKE 1 TABLET (25 MG TOTAL) BY MOUTH DAILY. 07/31/23   Christell Constant, MD  spironolactone (ALDACTONE) 25 MG tablet Take 0.5 tablets (12.5 mg total) by mouth daily. 12/12/22   Monica Becton, MD    Family History Family History  Problem Relation Age of Onset   Alcohol abuse Mother    Cancer Mother    Cancer Father    Heart disease Father    Diabetes Sister    Colon cancer Neg Hx    Colon polyps Neg Hx    Esophageal cancer Neg Hx    Rectal cancer Neg Hx    Stomach cancer Neg Hx     Social History Social History   Tobacco Use   Smoking status: Former    Current packs/day: 0.00    Average packs/day: 1 pack/day for 10.0 years (10.0 ttl pk-yrs)    Types: Cigarettes    Start date: 02/23/1979    Quit date: 02/22/1989    Years since quitting: 34.5   Smokeless tobacco: Never  Vaping Use   Vaping status: Never Used  Substance Use Topics   Alcohol use: Not Currently   Drug use: No  Allergies   Ambien [zolpidem], Penicillins, and Percocet [oxycodone-acetaminophen]   Review of Systems Review of Systems No sore throat No cough No pleuritic pain No wheezing No nasal congestion No post-nasal drainage No sinus pain/pressure No itchy/red eyes + earache No hemoptysis No SOB No fever/chills No nausea No vomiting No abdominal pain No diarrhea No urinary symptoms No skin rash No fatigue No myalgias No headache   Physical Exam Triage Vital Signs ED Triage Vitals  Encounter Vitals Group     BP 08/17/23 1222 (!) 145/83     Systolic BP Percentile --      Diastolic BP Percentile --      Pulse Rate 08/17/23 1222 85     Resp 08/17/23 1222 16     Temp 08/17/23 1222 97.6 F (36.4 C)     Temp src --       SpO2 08/17/23 1222 99 %     Weight --      Height --      Head Circumference --      Peak Flow --      Pain Score 08/17/23 1225 4     Pain Loc --      Pain Education --      Exclude from Growth Chart --    No data found.  Updated Vital Signs BP (!) 145/83   Pulse 85   Temp 97.6 F (36.4 C)   Resp 16   SpO2 99%   Visual Acuity Right Eye Distance:   Left Eye Distance:   Bilateral Distance:    Right Eye Near:   Left Eye Near:    Bilateral Near:     Physical Exam Vitals and nursing note reviewed.  Constitutional:      General: He is not in acute distress. HENT:     Head: Normocephalic.     Right Ear: External ear normal.     Left Ear: External ear normal.     Ears:     Comments: Right canal partly occluded with cerumen; post-lavage, the canal and tympanic membrane appear normal. Left canal completely occluded with cerumen; post-lavage, the canal and tympanic membrane appear normal.    Nose: Nose normal.     Mouth/Throat:     Pharynx: Oropharynx is clear.  Eyes:     Conjunctiva/sclera: Conjunctivae normal.     Pupils: Pupils are equal, round, and reactive to light.  Cardiovascular:     Rate and Rhythm: Normal rate.  Pulmonary:     Effort: Pulmonary effort is normal.  Lymphadenopathy:     Cervical: No cervical adenopathy.  Skin:    General: Skin is warm.  Neurological:     Mental Status: He is alert.      UC Treatments / Results  Labs (all labs ordered are listed, but only abnormal results are displayed) Labs Reviewed - No data to display  EKG   Radiology No results found.  Procedures Procedures (including critical care time)  Medications Ordered in UC Medications - No data to display  Initial Impression / Assessment and Plan / UC Course  I have reviewed the triage vital signs and the nursing notes.  Pertinent labs & imaging results that were available during my care of the patient were reviewed by me and considered in my medical decision  making (see chart for details).   No evidence otitis media.  Final Clinical Impressions(s) / UC Diagnoses   Final diagnoses:  Bilateral impacted cerumen     Discharge Instructions  To prevent recurrent ear wax blockage, try the following: Soak two cotton balls with mineral oil, and gently place in each ear canal once weekly.  Leave the cotton balls in place for 10 to 20 minutes.  This will help liquefy the ear wax and aid your body's normal elimination process.  Have your ears cleaned by a health professional every 6 to 12 months.  Avoid using "Q-tips" and ear wax softening solutions.  Call if increased pain or ear drainage develops.     ED Prescriptions   None       Lattie Haw, MD 08/18/23 2110

## 2023-09-14 ENCOUNTER — Ambulatory Visit (HOSPITAL_COMMUNITY): Payer: Managed Care, Other (non HMO)

## 2023-10-08 ENCOUNTER — Other Ambulatory Visit: Payer: Self-pay | Admitting: Internal Medicine

## 2023-10-08 DIAGNOSIS — I480 Paroxysmal atrial fibrillation: Secondary | ICD-10-CM

## 2023-10-08 NOTE — Telephone Encounter (Signed)
 Prescription refill request for Eliquis received. Indication:afib Last office visit:11/24 WUJ:WJXBJ labs Age: 63 Weight:93.4  kg  Prescription refilled

## 2023-10-16 ENCOUNTER — Ambulatory Visit (HOSPITAL_COMMUNITY): Payer: Managed Care, Other (non HMO) | Attending: Internal Medicine

## 2023-10-18 ENCOUNTER — Telehealth: Payer: Self-pay | Admitting: Pharmacy Technician

## 2023-10-18 ENCOUNTER — Encounter: Payer: Self-pay | Admitting: Internal Medicine

## 2023-10-18 ENCOUNTER — Other Ambulatory Visit (HOSPITAL_COMMUNITY): Payer: Self-pay

## 2023-10-18 NOTE — Telephone Encounter (Signed)
Pharmacy Patient Advocate Encounter   Received notification from Pt Calls Messages that prior authorization for Jardiance 10MG  tablets is required/requested.   Insurance verification completed.   The patient is insured through [employer] .   Per test claim: PA required; PA submitted to above mentioned insurance via CoverMyMeds Key/confirmation #/EOC [id number] Status is pending

## 2023-10-19 ENCOUNTER — Other Ambulatory Visit (HOSPITAL_COMMUNITY): Payer: Self-pay

## 2023-10-19 NOTE — Telephone Encounter (Signed)
Pharmacy Patient Advocate Encounter  Received notification from [employer] that Prior Authorization for Jardiance 10mg  has been APPROVED from 09/18/23 to 10/17/24. Ran test claim, Copay is $25.00. This test claim was processed through [facility]- copay amounts may vary at other pharmacies due to pharmacy/plan contracts, or as the patient moves through the different stages of their insurance plan.  I called [facility] and they said they did not have a valid rx on hand, the one they have is expired so they will need a new rx sent in please   PA #/Case ID/Reference #: [id number]

## 2023-10-21 ENCOUNTER — Other Ambulatory Visit: Payer: Self-pay | Admitting: Sports Medicine

## 2023-10-21 DIAGNOSIS — I5022 Chronic systolic (congestive) heart failure: Secondary | ICD-10-CM

## 2023-10-25 ENCOUNTER — Other Ambulatory Visit: Payer: Self-pay

## 2023-10-25 DIAGNOSIS — I5022 Chronic systolic (congestive) heart failure: Secondary | ICD-10-CM

## 2023-10-25 MED ORDER — LOSARTAN POTASSIUM 25 MG PO TABS: 25.0000 mg | ORAL_TABLET | Freq: Every day | ORAL | 3 refills | Status: DC

## 2023-11-02 ENCOUNTER — Other Ambulatory Visit: Payer: Self-pay | Admitting: Sports Medicine

## 2023-11-02 DIAGNOSIS — I5022 Chronic systolic (congestive) heart failure: Secondary | ICD-10-CM

## 2023-11-06 ENCOUNTER — Encounter (HOSPITAL_COMMUNITY): Payer: Self-pay | Admitting: Internal Medicine

## 2023-11-06 ENCOUNTER — Ambulatory Visit (HOSPITAL_COMMUNITY): Payer: Managed Care, Other (non HMO) | Attending: Internal Medicine

## 2023-11-24 ENCOUNTER — Other Ambulatory Visit: Payer: Self-pay | Admitting: Internal Medicine

## 2023-11-26 NOTE — Telephone Encounter (Signed)
 Prescription refill request for Eliquis received. Indication:afib Last office visit:11/24 WUJ:WJXBJ labs Age: 63 Weight:93.4  kg  Prescription refilled

## 2023-12-05 ENCOUNTER — Encounter (INDEPENDENT_AMBULATORY_CARE_PROVIDER_SITE_OTHER): Payer: Self-pay | Admitting: Sports Medicine

## 2023-12-05 DIAGNOSIS — M47816 Spondylosis without myelopathy or radiculopathy, lumbar region: Secondary | ICD-10-CM

## 2023-12-05 DIAGNOSIS — I5022 Chronic systolic (congestive) heart failure: Secondary | ICD-10-CM

## 2023-12-05 MED ORDER — PREDNISONE 50 MG PO TABS: ORAL_TABLET | ORAL | 0 refills | Status: DC

## 2023-12-05 NOTE — Telephone Encounter (Signed)

## 2023-12-10 NOTE — Telephone Encounter (Signed)
 Okay to refill all of his meds

## 2023-12-10 NOTE — Addendum Note (Signed)
 Addended by: Monica Becton on: 12/10/2023 04:56 PM   Modules accepted: Orders

## 2023-12-12 ENCOUNTER — Ambulatory Visit: Admitting: Sports Medicine

## 2023-12-12 VITALS — BP 119/91 | HR 91 | Resp 20 | Ht 71.0 in | Wt 210.4 lb

## 2023-12-12 DIAGNOSIS — M47816 Spondylosis without myelopathy or radiculopathy, lumbar region: Secondary | ICD-10-CM | POA: Diagnosis not present

## 2023-12-12 DIAGNOSIS — N139 Obstructive and reflux uropathy, unspecified: Secondary | ICD-10-CM

## 2023-12-12 DIAGNOSIS — I5022 Chronic systolic (congestive) heart failure: Secondary | ICD-10-CM

## 2023-12-12 DIAGNOSIS — E785 Hyperlipidemia, unspecified: Secondary | ICD-10-CM | POA: Diagnosis not present

## 2023-12-12 DIAGNOSIS — Z0000 Encounter for general adult medical examination without abnormal findings: Secondary | ICD-10-CM

## 2023-12-12 MED ORDER — EMPAGLIFLOZIN 10 MG PO TABS: 10.0000 mg | ORAL_TABLET | Freq: Every day | ORAL | 3 refills | Status: AC

## 2023-12-12 MED ORDER — APIXABAN 5 MG PO TABS
5.0000 mg | ORAL_TABLET | Freq: Two times a day (BID) | ORAL | 3 refills | Status: AC
Start: 2023-12-12 — End: ?

## 2023-12-12 MED ORDER — SPIRONOLACTONE 25 MG PO TABS: 12.5000 mg | ORAL_TABLET | Freq: Every day | ORAL | 3 refills | Status: AC

## 2023-12-12 MED ORDER — BISOPROLOL FUMARATE 10 MG PO TABS: 10.0000 mg | ORAL_TABLET | Freq: Every day | ORAL | 3 refills | Status: AC

## 2023-12-12 MED ORDER — ATORVASTATIN CALCIUM 40 MG PO TABS: 40.0000 mg | ORAL_TABLET | Freq: Every day | ORAL | 3 refills | Status: AC

## 2023-12-12 MED ORDER — LOSARTAN POTASSIUM 25 MG PO TABS: 25.0000 mg | ORAL_TABLET | Freq: Every day | ORAL | 3 refills | Status: AC

## 2023-12-12 NOTE — Progress Notes (Signed)
    Procedures performed today:    None.  Independent interpretation of notes and tests performed by another provider:   None.  Brief History, Exam, Impression, and Recommendations:    Lumbar spondylosis We did a burst of prednisone and has improved considerably, we can do this a few times a year, he will continue working on his home exercises, return as needed.  Hyperlipemia Rechecking labs.  Chronic systolic heart failure (HCC) Stable, he is on a good medication regimen, asymptomatic, refilling his medications.  Annual physical exam We do need to get Latrail back in at some point for an annual physical, we are getting routine labs today. He has declined Shingrix in the past.    ____________________________________________ Ihor Austin. Benjamin Stain, M.D., ABFM., CAQSM., AME. Primary Care and Sports Medicine [facility]  Adjunct Professor of Family Medicine  [employer] [facility] [employer]  Restaurant manager, fast food

## 2023-12-12 NOTE — Assessment & Plan Note (Signed)
 We do need to get Aaron Crawford back in at some point for an annual physical, we are getting routine labs today. He has declined Shingrix in the past.

## 2023-12-12 NOTE — Assessment & Plan Note (Signed)
 Stable, he is on a good medication regimen, asymptomatic, refilling his medications.

## 2023-12-12 NOTE — Telephone Encounter (Signed)
 Per patient chart-Patient scheduled today with Dr. Benjamin Stain.

## 2023-12-12 NOTE — Assessment & Plan Note (Signed)
 We did a burst of prednisone and has improved considerably, we can do this a few times a year, he will continue working on his home exercises, return as needed.

## 2023-12-12 NOTE — Assessment & Plan Note (Signed)
Rechecking labs 

## 2023-12-13 ENCOUNTER — Encounter: Payer: Self-pay | Admitting: Sports Medicine

## 2023-12-13 LAB — PSA, TOTAL AND FREE
PSA, Free Pct: 30 %
PSA, Free: 0.21 ng/mL
Prostate Specific Ag, Serum: 0.7 ng/mL (ref 0.0–4.0)

## 2023-12-13 LAB — COMPREHENSIVE METABOLIC PANEL
ALT: 31 IU/L (ref 0–44)
AST: 22 IU/L (ref 0–40)
Albumin: 4.5 g/dL (ref 3.8–4.9)
Alkaline Phosphatase: 98 IU/L (ref 44–121)
BUN/Creatinine Ratio: 18 (ref 10–24)
BUN: 12 mg/dL (ref 8–27)
Bilirubin Total: 0.5 mg/dL (ref 0.0–1.2)
CO2: 22 mmol/L (ref 20–29)
Calcium: 9.8 mg/dL (ref 8.6–10.2)
Chloride: 103 mmol/L (ref 96–106)
Creatinine, Ser: 0.68 mg/dL — ABNORMAL LOW (ref 0.76–1.27)
Globulin, Total: 2.1 g/dL (ref 1.5–4.5)
Glucose: 90 mg/dL (ref 70–99)
Potassium: 4.9 mmol/L (ref 3.5–5.2)
Sodium: 140 mmol/L (ref 134–144)
Total Protein: 6.6 g/dL (ref 6.0–8.5)
eGFR: 106 mL/min/{1.73_m2} (ref >59)

## 2023-12-13 LAB — LIPID PANEL
Chol/HDL Ratio: 3.4 ratio (ref 0.0–5.0)
Cholesterol, Total: 182 mg/dL (ref 100–199)
HDL: 53 mg/dL (ref >39)
LDL Chol Calc (NIH): 93 mg/dL (ref 0–99)
Triglycerides: 216 mg/dL — ABNORMAL HIGH (ref 0–149)
VLDL Cholesterol Cal: 36 mg/dL (ref 5–40)

## 2023-12-13 LAB — CBC
Hematocrit: 48.2 % (ref 37.5–51.0)
Hemoglobin: 15.3 g/dL (ref 13.0–17.7)
MCH: 27 pg (ref 26.6–33.0)
MCHC: 31.7 g/dL (ref 31.5–35.7)
MCV: 85 fL (ref 79–97)
Platelets: 356 10*3/uL (ref 150–450)
RBC: 5.67 x10E6/uL (ref 4.14–5.80)
RDW: 13.2 % (ref 11.6–15.4)
WBC: 8.6 10*3/uL (ref 3.4–10.8)

## 2023-12-13 LAB — TSH: TSH: 1.22 u[IU]/mL (ref 0.450–4.500)

## 2023-12-13 LAB — HEMOGLOBIN A1C
Est. average glucose Bld gHb Est-mCnc: 131 mg/dL
Hgb A1c MFr Bld: 6.2 % — ABNORMAL HIGH (ref 4.8–5.6)

## 2023-12-19 ENCOUNTER — Other Ambulatory Visit: Payer: Self-pay | Admitting: Sports Medicine

## 2023-12-19 DIAGNOSIS — I5022 Chronic systolic (congestive) heart failure: Secondary | ICD-10-CM

## 2024-01-16 ENCOUNTER — Ambulatory Visit (INDEPENDENT_AMBULATORY_CARE_PROVIDER_SITE_OTHER): Payer: BC Managed Care – PPO | Admitting: Sports Medicine

## 2024-01-16 ENCOUNTER — Encounter: Payer: Self-pay | Admitting: Sports Medicine

## 2024-01-16 VITALS — BP 112/73 | HR 75 | Resp 20 | Ht 71.0 in | Wt 204.0 lb

## 2024-01-16 DIAGNOSIS — M25511 Pain in right shoulder: Secondary | ICD-10-CM | POA: Insufficient documentation

## 2024-01-16 DIAGNOSIS — Z0000 Encounter for general adult medical examination without abnormal findings: Secondary | ICD-10-CM | POA: Diagnosis not present

## 2024-01-16 DIAGNOSIS — G8929 Other chronic pain: Secondary | ICD-10-CM | POA: Diagnosis not present

## 2024-01-16 DIAGNOSIS — H6123 Impacted cerumen, bilateral: Secondary | ICD-10-CM | POA: Diagnosis not present

## 2024-01-16 DIAGNOSIS — M25512 Pain in left shoulder: Secondary | ICD-10-CM

## 2024-01-16 NOTE — Assessment & Plan Note (Signed)
 Annual physical as above, declines vaccinations. Return to see me in a year.

## 2024-01-16 NOTE — Assessment & Plan Note (Signed)
 Bilateral cerumen impactions, cleared with irrigation.

## 2024-01-16 NOTE — Progress Notes (Signed)
 Subjective:    CC: Annual Physical Exam  HPI:  This patient is here for their annual physical  I reviewed the past medical history, family history, social history, surgical history, and allergies today and no changes were needed.  Please see the problem list section below in epic for further details.  Past Medical History: Past Medical History:  Diagnosis Date   Hernia    Hyperlipidemia    Past Surgical History: Past Surgical History:  Procedure Laterality Date   CARDIOVERSION N/A 03/31/2022   Procedure: CARDIOVERSION;  Surgeon: Hazle Lites, MD;  Location: [facility] ENDOSCOPY;  Service: Cardiovascular;  Laterality: N/A;   TEE WITHOUT CARDIOVERSION N/A 03/31/2022   Procedure: TRANSESOPHAGEAL ECHOCARDIOGRAM (TEE);  Surgeon: Hazle Lites, MD;  Location: [facility] ENDOSCOPY;  Service: Cardiovascular;  Laterality: N/A;   Social History: Social History   Socioeconomic History   Marital status: Married    Spouse name: Thersia Flax   Number of children: 4   Years of education: 11   Highest education level: 12th grade  Occupational History   Occupation: Hospital doctor    Comment: self employed  Tobacco Use   Smoking status: Former    Current packs/day: 0.00    Average packs/day: 1 pack/day for 10.0 years (10.0 ttl pk-yrs)    Types: Cigarettes    Start date: 02/23/1979    Quit date: 02/22/1989    Years since quitting: 34.9   Smokeless tobacco: Never  Vaping Use   Vaping status: Never Used  Substance and Sexual Activity   Alcohol use: Not Currently   Drug use: No   Sexual activity: Yes    Birth control/protection: None  Other Topics Concern   Not on file  Social History Narrative   Not on file   Social Drivers of Health   Financial Resource Strain: Medium Risk (01/15/2023)   Overall Financial Resource Strain (CARDIA)    Difficulty of Paying Living Expenses: Somewhat hard  Food Insecurity: No Food Insecurity (01/15/2023)   Hunger Vital Sign    Worried About Running Out of Food  in the Last Year: Never true    Ran Out of Food in the Last Year: Never true  Transportation Needs: No Transportation Needs (01/15/2023)   PRAPARE - Administrator, Civil Service (Medical): No    Lack of Transportation (Non-Medical): No  Physical Activity: Unknown (01/15/2023)   Exercise Vital Sign    Days of Exercise per Week: 5 days    Minutes of Exercise per Session: Patient declined  Stress: No Stress Concern Present (01/15/2023)   [employer] of Occupational Health - Occupational Stress Questionnaire    Feeling of Stress : Not at all  Social Connections: Socially Integrated (01/15/2023)   Social Connection and Isolation Panel [NHANES]    Frequency of Communication with Friends and Family: More than three times a week    Frequency of Social Gatherings with Friends and Family: Twice a week    Attends Religious Services: More than 4 times per year    Active Member of [employer] or Organizations: Yes    Attends Engineer, structural: More than 4 times per year    Marital Status: Married   Family History: Family History  Problem Relation Age of Onset   Alcohol abuse Mother    Cancer Mother    Cancer Father    Heart disease Father    Diabetes Sister    Colon cancer Neg Hx    Colon polyps Neg Hx  Esophageal cancer Neg Hx    Rectal cancer Neg Hx    Stomach cancer Neg Hx    Allergies: Allergies  Allergen Reactions   Ambien  [Zolpidem ] Other (See Comments)    Panic Attack   Penicillins Other (See Comments)    "i've always been told since I was a kid, that I am allergic, but I don't know what type of reaction."   Percocet [Oxycodone-Acetaminophen] Anxiety   Medications: See med rec.  Review of Systems: No headache, visual changes, nausea, vomiting, diarrhea, constipation, dizziness, abdominal pain, skin rash, fevers, chills, night sweats, swollen lymph nodes, weight loss, chest pain, body aches, joint swelling, muscle aches, shortness of breath, mood changes,  visual or auditory hallucinations.  Objective:    General: Well Developed, well nourished, and in no acute distress.  Neuro: Alert and oriented x3, extra-ocular muscles intact, sensation grossly intact. Cranial nerves II through XII are intact, motor, sensory, and coordinative functions are all intact. HEENT: Normocephalic, atraumatic, pupils equal round reactive to light, neck supple, no masses, no lymphadenopathy, thyroid  nonpalpable. Oropharynx, nasopharynx, external ear canals are unremarkable. Skin: Warm and dry, no rashes noted.  Cardiac: Regular rate and rhythm, no murmurs rubs or gallops.  Respiratory: Clear to auscultation bilaterally. Not using accessory muscles, speaking in full sentences.  Abdominal: Soft, nontender, nondistended, positive bowel sounds, no masses, no organomegaly.  Musculoskeletal: Positive Neer's, Hawkins, empty can signs, speeds test., elbow, wrist, hip, knee, ankle stable, and with full range of motion.  Indication: Cerumen impaction of both ear(s) Medical necessity statement: On physical examination, cerumen impairs clinically significant portions of the external auditory canal, and tympanic membrane. Noted obstructive, copious cerumen that cannot be removed without magnification and instrumentations requiring physician skills Consent: Discussed benefits and risks of procedure and verbal consent obtained Procedure: Patient was prepped for the procedure. Utilized an otoscope to assess and take note of the ear canal, the tympanic membrane, and the presence, amount, and placement of the cerumen. Gentle water irrigation and soft plastic curette was utilized to remove cerumen.  Post procedure examination: shows cerumen was completely removed. Patient tolerated procedure well. The patient is made aware that they may experience temporary vertigo, temporary hearing loss, and temporary discomfort. If these symptom last for more than 24 hours to call the clinic or proceed to  the ED.  Impression and Recommendations:    The patient was counselled, risk factors were discussed, anticipatory guidance given.  Annual physical exam Annual physical as above, declines vaccinations. Return to see me in a year.  Bilateral shoulder pain Bilateral shoulder pain over the deltoids worse with abduction. No trauma. He has worked in Production designer, theatre/television/film with Insurance account manager. Impingement signs on exam with significant weakness to abduction, positive speeds test as well all pointing to a rotator cuff tear and bicipital pathology. We discussed the anatomy and pathophysiology, we will start conservatively, x-rays, home PT, if insufficient improvement after about 6 to 8 weeks we will consider MRIs and surgical referral.  Bilateral hearing loss due to cerumen impaction Bilateral cerumen impactions, cleared with irrigation.   ____________________________________________ Joselyn Nicely. Sandy Crumb, M.D., ABFM., CAQSM., AME. Primary Care and Sports Medicine [facility]  Adjunct Professor of [facility] Medicine  [employer]  Restaurant manager, fast food

## 2024-01-16 NOTE — Assessment & Plan Note (Signed)
 Bilateral shoulder pain over the deltoids worse with abduction. No trauma. He has worked in Production designer, theatre/television/film with Insurance account manager. Impingement signs on exam with significant weakness to abduction, positive speeds test as well all pointing to a rotator cuff tear and bicipital pathology. We discussed the anatomy and pathophysiology, we will start conservatively, x-rays, home PT, if insufficient improvement after about 6 to 8 weeks we will consider MRIs and surgical referral.

## 2024-02-14 ENCOUNTER — Ambulatory Visit: Admitting: Sports Medicine

## 2024-02-14 ENCOUNTER — Other Ambulatory Visit (INDEPENDENT_AMBULATORY_CARE_PROVIDER_SITE_OTHER)

## 2024-02-14 DIAGNOSIS — G8929 Other chronic pain: Secondary | ICD-10-CM

## 2024-02-14 DIAGNOSIS — M25562 Pain in left knee: Secondary | ICD-10-CM

## 2024-02-14 DIAGNOSIS — M1712 Unilateral primary osteoarthritis, left knee: Secondary | ICD-10-CM

## 2024-02-14 MED ORDER — TRIAMCINOLONE ACETONIDE 40 MG/ML IJ SUSP
40.0000 mg | Freq: Once | INTRAMUSCULAR | Status: AC
  Administered 2024-02-14: 40 mg via INTRA_ARTICULAR

## 2024-02-14 NOTE — Progress Notes (Signed)
    Procedures performed today:    Procedure: Real-time Ultrasound Guided injection of the left knee Device: Samsung HS60  Verbal informed consent obtained.  Time-out conducted.  Noted no overlying erythema, induration, or other signs of local infection.  Skin prepped in a sterile fashion.  Local anesthesia: Topical Ethyl chloride.  With sterile technique and under real time ultrasound guidance: Trace effusion noted, 1 cc Kenalog  40, 2 cc lidocaine , 2 cc bupivacaine injected easily Completed without difficulty  Advised to call if fevers/chills, erythema, induration, drainage, or persistent bleeding.  Images permanently stored and available for review in PACS.  Impression: Technically successful ultrasound guided injection.  Independent interpretation of notes and tests performed by another provider:   None.  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 Recurrence of pain, repeat left knee injection today, return to see me as needed.    ____________________________________________ Joselyn Nicely. Sandy Crumb, M.D., ABFM., CAQSM., AME. Primary Care and Sports Medicine [facility]  Adjunct Professor of [facility] Medicine  [employer]  Restaurant manager, fast food

## 2024-02-14 NOTE — Assessment & Plan Note (Signed)
 Very pleasant 63 year old male, known primary osteoarthritis of the left knee with degenerative meniscal tearing on MRI from 2017, his last injection was in May 2023. Recurrence of pain, repeat left knee injection today, return to see me as needed.

## 2024-02-27 ENCOUNTER — Encounter: Payer: Self-pay | Admitting: Sports Medicine

## 2024-02-27 ENCOUNTER — Ambulatory Visit: Admitting: Sports Medicine

## 2024-02-27 DIAGNOSIS — M1712 Unilateral primary osteoarthritis, left knee: Secondary | ICD-10-CM | POA: Diagnosis not present

## 2024-02-27 NOTE — Progress Notes (Signed)
    Procedures performed today:    None.  Independent interpretation of notes and tests performed by another provider:   None.  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    ____________________________________________ Joselyn Nicely. Sandy Crumb, M.D., ABFM., CAQSM., AME. Primary Care and Sports Medicine [facility]  Adjunct Professor of [facility] Medicine  [employer]  Restaurant manager, fast food

## 2024-02-27 NOTE — Assessment & Plan Note (Signed)
 Known left knee osteoarthritis, degenerative meniscal tearing on MRI from 2017, injected May 2023, injected May 2025, doing really well, return as needed.

## 2024-03-24 ENCOUNTER — Encounter (INDEPENDENT_AMBULATORY_CARE_PROVIDER_SITE_OTHER): Payer: Self-pay | Admitting: Sports Medicine

## 2024-03-24 DIAGNOSIS — E785 Hyperlipidemia, unspecified: Secondary | ICD-10-CM

## 2024-03-25 NOTE — Assessment & Plan Note (Signed)
 Patient wishes to come off of atorvastatin  due to things he read on Facebook. He is aware that statins reduces cardiovascular mortality, he is aware of the increased risk of heart attack and stroke. We will respect patient autonomy.

## 2024-03-25 NOTE — Telephone Encounter (Signed)

## 2024-05-27 ENCOUNTER — Encounter: Payer: Self-pay | Admitting: Sports Medicine
